# Patient Record
Sex: Male | Born: 1955 | Race: White | Hispanic: No | Marital: Married | Smoking: Former smoker
Health system: Southern US, Community
[De-identification: ages and names within clinical notes are randomized; demographics above are authoritative.]

## PROBLEM LIST (undated history)

## (undated) DIAGNOSIS — R112 Nausea with vomiting, unspecified: Secondary | ICD-10-CM

## (undated) DIAGNOSIS — S5290XA Unspecified fracture of unspecified forearm, initial encounter for closed fracture: Secondary | ICD-10-CM

## (undated) DIAGNOSIS — C801 Malignant (primary) neoplasm, unspecified: Secondary | ICD-10-CM

## (undated) DIAGNOSIS — M1990 Unspecified osteoarthritis, unspecified site: Secondary | ICD-10-CM

## (undated) DIAGNOSIS — R030 Elevated blood-pressure reading, without diagnosis of hypertension: Secondary | ICD-10-CM

## (undated) DIAGNOSIS — I10 Essential (primary) hypertension: Secondary | ICD-10-CM

## (undated) DIAGNOSIS — IMO0001 Reserved for inherently not codable concepts without codable children: Secondary | ICD-10-CM

## (undated) DIAGNOSIS — K5900 Constipation, unspecified: Secondary | ICD-10-CM

## (undated) DIAGNOSIS — Z9889 Other specified postprocedural states: Secondary | ICD-10-CM

## (undated) HISTORY — PX: APPENDECTOMY: SHX54

## (undated) HISTORY — PX: VASECTOMY: SHX75

## (undated) HISTORY — PX: COLON SURGERY: SHX602

## (undated) HISTORY — PX: ANTERIOR CERVICAL DECOMP/DISCECTOMY FUSION: SHX1161

## (undated) HISTORY — PX: FINGER SURGERY: SHX640

## (undated) HISTORY — DX: Essential (primary) hypertension: I10

## (undated) HISTORY — PX: BACK SURGERY: SHX140

## (undated) HISTORY — PX: NASAL RECONSTRUCTION: SHX2069

---

## 2005-01-05 ENCOUNTER — Emergency Department (HOSPITAL_COMMUNITY): Admission: EM | Admit: 2005-01-05 | Discharge: 2005-01-05 | Payer: Self-pay | Admitting: Emergency Medicine

## 2005-04-09 ENCOUNTER — Encounter: Admission: RE | Admit: 2005-04-09 | Discharge: 2005-07-08 | Payer: Self-pay | Admitting: Internal Medicine

## 2005-10-21 ENCOUNTER — Emergency Department (HOSPITAL_COMMUNITY): Admission: EM | Admit: 2005-10-21 | Discharge: 2005-10-22 | Payer: Self-pay | Admitting: Emergency Medicine

## 2005-10-23 ENCOUNTER — Encounter: Admission: RE | Admit: 2005-10-23 | Discharge: 2005-10-23 | Payer: Self-pay | Admitting: Internal Medicine

## 2005-10-29 ENCOUNTER — Encounter: Admission: RE | Admit: 2005-10-29 | Discharge: 2005-10-29 | Payer: Self-pay | Admitting: Internal Medicine

## 2007-09-11 ENCOUNTER — Encounter (INDEPENDENT_AMBULATORY_CARE_PROVIDER_SITE_OTHER): Payer: Self-pay | Admitting: Orthopedic Surgery

## 2007-09-11 ENCOUNTER — Ambulatory Visit (HOSPITAL_BASED_OUTPATIENT_CLINIC_OR_DEPARTMENT_OTHER): Admission: RE | Admit: 2007-09-11 | Discharge: 2007-09-11 | Payer: Self-pay | Admitting: Orthopedic Surgery

## 2009-01-08 ENCOUNTER — Inpatient Hospital Stay (HOSPITAL_COMMUNITY): Admission: EM | Admit: 2009-01-08 | Discharge: 2009-01-09 | Payer: Self-pay | Admitting: Emergency Medicine

## 2009-01-13 ENCOUNTER — Encounter: Admission: RE | Admit: 2009-01-13 | Discharge: 2009-01-13 | Payer: Self-pay | Admitting: General Surgery

## 2009-04-07 ENCOUNTER — Inpatient Hospital Stay (HOSPITAL_COMMUNITY): Admission: RE | Admit: 2009-04-07 | Discharge: 2009-04-10 | Payer: Self-pay | Admitting: General Surgery

## 2009-04-07 ENCOUNTER — Encounter (INDEPENDENT_AMBULATORY_CARE_PROVIDER_SITE_OTHER): Payer: Self-pay | Admitting: General Surgery

## 2009-04-28 ENCOUNTER — Ambulatory Visit: Payer: Self-pay | Admitting: Oncology

## 2009-04-28 LAB — CBC WITH DIFFERENTIAL/PLATELET
Basophils Absolute: 0 10*3/uL (ref 0.0–0.1)
Eosinophils Absolute: 0.1 10*3/uL (ref 0.0–0.5)
HCT: 41.2 % (ref 38.4–49.9)
HGB: 14.6 g/dL (ref 13.0–17.1)
MONO#: 0.4 10*3/uL (ref 0.1–0.9)
NEUT#: 4.1 10*3/uL (ref 1.5–6.5)
NEUT%: 66.5 % (ref 39.0–75.0)
RDW: 12.6 % (ref 11.0–14.6)
lymph#: 1.5 10*3/uL (ref 0.9–3.3)

## 2009-04-28 LAB — COMPREHENSIVE METABOLIC PANEL
AST: 16 U/L (ref 0–37)
Albumin: 4.8 g/dL (ref 3.5–5.2)
BUN: 10 mg/dL (ref 6–23)
CO2: 26 mEq/L (ref 19–32)
Calcium: 9.7 mg/dL (ref 8.4–10.5)
Chloride: 103 mEq/L (ref 96–112)
Glucose, Bld: 114 mg/dL — ABNORMAL HIGH (ref 70–99)
Potassium: 5.1 mEq/L (ref 3.5–5.3)

## 2009-04-28 LAB — LACTATE DEHYDROGENASE: LDH: 175 U/L (ref 94–250)

## 2009-10-27 ENCOUNTER — Ambulatory Visit: Payer: Self-pay | Admitting: Oncology

## 2010-10-20 LAB — CBC
HCT: 42.8 % (ref 39.0–52.0)
Hemoglobin: 14.9 g/dL (ref 13.0–17.0)
MCHC: 34.8 g/dL (ref 30.0–36.0)
MCHC: 35.3 g/dL (ref 30.0–36.0)
Platelets: 215 10*3/uL (ref 150–400)
RBC: 4.11 MIL/uL — ABNORMAL LOW (ref 4.22–5.81)
RBC: 4.54 MIL/uL (ref 4.22–5.81)
RDW: 12.9 % (ref 11.5–15.5)
RDW: 13.1 % (ref 11.5–15.5)
WBC: 4.8 10*3/uL (ref 4.0–10.5)
WBC: 8.6 10*3/uL (ref 4.0–10.5)

## 2010-10-20 LAB — COMPREHENSIVE METABOLIC PANEL
ALT: 28 U/L (ref 0–53)
Albumin: 4 g/dL (ref 3.5–5.2)
Alkaline Phosphatase: 60 U/L (ref 39–117)
Calcium: 9.1 mg/dL (ref 8.4–10.5)
Creatinine, Ser: 1.09 mg/dL (ref 0.4–1.5)
Glucose, Bld: 112 mg/dL — ABNORMAL HIGH (ref 70–99)
Total Bilirubin: 0.8 mg/dL (ref 0.3–1.2)
Total Protein: 7 g/dL (ref 6.0–8.3)

## 2010-10-20 LAB — BASIC METABOLIC PANEL
BUN: 12 mg/dL (ref 6–23)
Chloride: 106 mEq/L (ref 96–112)
Creatinine, Ser: 1.03 mg/dL (ref 0.4–1.5)
GFR calc Af Amer: >60 mL/min (ref >60)
Potassium: 4.3 mEq/L (ref 3.5–5.1)
Sodium: 136 mEq/L (ref 135–145)

## 2010-10-20 LAB — DIFFERENTIAL
Basophils Absolute: 0 10*3/uL (ref 0.0–0.1)
Basophils Relative: 1 % (ref 0–1)
Eosinophils Absolute: 0.1 10*3/uL (ref 0.0–0.7)
Monocytes Absolute: 0.4 10*3/uL (ref 0.1–1.0)

## 2010-10-20 LAB — PROTIME-INR: INR: 1 (ref 0.00–1.49)

## 2010-10-23 LAB — FUNGUS CULTURE W SMEAR: Fungal Smear: NONE SEEN

## 2010-10-23 LAB — CULTURE, ROUTINE-ABSCESS

## 2010-10-23 LAB — ANAEROBIC CULTURE

## 2010-10-23 LAB — DIFFERENTIAL
Basophils Relative: 0 % (ref 0–1)
Eosinophils Relative: 2 % (ref 0–5)
Lymphocytes Relative: 25 % (ref 12–46)
Monocytes Absolute: 0.7 10*3/uL (ref 0.1–1.0)
Neutro Abs: 4.8 10*3/uL (ref 1.7–7.7)

## 2010-10-23 LAB — CBC
HCT: 39.5 % (ref 39.0–52.0)
MCV: 95.7 fL (ref 78.0–100.0)

## 2010-10-23 LAB — CREATININE, SERUM
Creatinine, Ser: 1.06 mg/dL (ref 0.4–1.5)
GFR calc Af Amer: >60 mL/min (ref >60)

## 2010-10-23 LAB — FUNGAL STAIN

## 2010-10-23 LAB — AFB CULTURE WITH SMEAR (NOT AT ARMC): Acid Fast Smear: NONE SEEN

## 2010-10-23 LAB — AFB STAIN: Acid Fast Smear: NONE SEEN

## 2010-11-28 NOTE — Op Note (Signed)
NAMEMARCH, STEYER               ACCOUNT NO.:  [id number]   MEDICAL RECORD NO.:  [id number]          PATIENT TYPE:  INP   LOCATION:  5034                         FACILITY:  [facility]   PHYSICIAN:  Katy Fitch. Sypher, M.D. DATE OF BIRTH:  08/07/1955   DATE OF PROCEDURE:  01/08/2009  DATE OF DISCHARGE:                               OPERATIVE REPORT   PREOPERATIVE DIAGNOSIS:  Probable methicillin-resistant Staphylococcus  aureus abscess, dorsal radial aspect of left wrist and proximal forearm  with multiple other areas of folliculitis.   POSTOPERATIVE DIAGNOSIS:  Probable methicillin-resistant Staphylococcus  aureus abscess, dorsal radial aspect of left wrist and proximal forearm  with multiple other areas of folliculitis.   OPERATION:  Incision and drainage of complex abscess, dorsal radial  aspect left wrist and abscess proximal dorsal left forearm with cultures  for aerobic and anaerobic growth and AFB fungal smears.   OPERATIONS:  Katy Fitch. Sypher, MD   ASSISTANT:  No assistant.   ANESTHESIA:  General by LMA.   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  He progressed to have 2  abscesses on his left forearm and left hand.  He was seen by his primary  care physician, Dr. Nehemiah Settle who is covering for Dr. Valentina Lucks on January 07, 2009, and was noted to have early abscess formation.  He was referred  for an urgent upper extremity orthopedic consult.  Clinical examination  revealed signs of an impending abscess.  He was started on oral  doxycycline 100 mg p.o. q.12 h.  Unfortunately, he did not improve,  developed chills and fever with a measured temperature at 100+  Fahrenheit.   We kept in close phone contact on the afternoon and evening of January 07, 2009, and by midnight on January 07, 2009, his swelling was increasing,  rubor was increasing, and the central  portion of his abscess was  becoming violaceous.   I met him at the [facility] Emergency Room at 12:30 a.m. on January 08, 2009,  admitted him and placed him on IV vancomycin.  He was placed n.p.o. on  IV fluids support and arrangements made for incision and drainage this  morning.   After informed consent, he is brought to the operating room anticipating  incision and drainage of his abscesses.   PROCEDURE:  Gardiner Espana is brought to the operating room and placed in  supine position upon operating table.  Dr. Jean Rosenthal performed an  anesthesia consult in holding area.  Preoperatively, an EKG was obtained  due to his age and history of hypertension.  His CBC was checked and was  notable for a white count of 7.6.  Vital signs revealed a temperature  elevation over 100.  He did not have significant lymph node swelling at  the medial epicondyle, but did have tenderness in his axilla.  He was  beginning to develop ascending lymphangitis overnight.   PROCEDURE:  Julius Matus was brought to room 1 of the [facility] Operating  Room and  placed in supine position upon operating table.  Under Dr.  Rosalene Billings Jackson's direct supervision, general anesthesia by LMA  technique was induced.  The left arm was placed on arm table and a  pneumatic tourniquet applied to proximal left brachium.  The left arm  was prepped with Betadine soap solution and sterilely draped.  After  elevation for 1 minute, the arterial tourniquet on the proximal brachium  was inflated to 250 mmHg.  The procedure commenced with elliptical  excision of the necrotic skin overlying the abscess center.  Subcutaneous tissues were carefully spread with blunt hemostat  recovering copious amounts of frank pus.  Samples were cultured for  aerobic, anaerobic growth, AFB, and fungus smear.   The wound was thoroughly irrigated and continually dissected until all  purulent material was removed.  Copious irrigation of the subcutaneous  tissues was  continued until the effluent was clear.   The wound was then packed with 0.25% Iodoform gauze and left open.  The  proximal forearm wound was likewise attended with a longitudinal  incision through the abscess cavity.  Purulent material was recovered.  Hemostat was used to spread the subcutaneous space followed by packing  with Iodoform.   Both wounds were dressed with sterile 4x4s, sterile Kerlix, and Ace  wrap.   The tourniquet was released with immediate cap refill to the fingers and  thumb.   Ms. Ragas tolerated surgery and anesthesia well.  He was awakened from  general anesthesia, transferred to recovery room with stable vital  signs.  He will be admitted to the hospital for postoperative care and  will continue on the IV vancomycin protocol.   He is provided appropriate analgesics in the form of p.o. and IV  Dilaudid as well as p.o. Percocet.   We will follow cultures and determine whether or not if the patient is  able to continue on home IV therapy versus continued inpatient  management.      Katy Fitch Sypher, M.D.  Electronically Signed     RVS/MEDQ  D:  01/08/2009  T:  01/08/2009  Job:  [id number]   cc:   Deirdre Peer. Polite, M.D.  Thora Lance, M.D.

## 2010-11-28 NOTE — H&P (Signed)
[name], Phillip               ACCOUNT NO.:  [id number]   MEDICAL RECORD NO.:  [id number]          PATIENT TYPE:  INP   LOCATION:  5034                         FACILITY:  [facility]   PHYSICIAN:  Katy Fitch. Sypher, M.D. DATE OF BIRTH:  June 03, 1956   DATE OF ADMISSION:  01/08/2009  DATE OF DISCHARGE:                              HISTORY & PHYSICAL   ADMITTING DIAGNOSES:  Probable MRSA abscesses x2, dorsal radial aspect  of left hand and wrist, and ulnar aspect of left forearm with  deterioration in clinical condition, rule out ascending lymphangitis.   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   On January 01, 2009, he noted a red itchy spot on the dorsal aspect of his  left hand over the index carpometacarpal joint.  He started scratching  this area and immediately noted the development of rubor.  Within 48  hours, he had swelling and increasing pain.  He returned home from a  long-distance trip and was noted to have what appeared to be a rash  developing around the area of the presumed bite and subsequently noted a  second area on his dorsal forearm where he may have scratched this well.  He has had a history of a recent colonoscopy with a diagnosis of  probable malignant colon polyp.  He is being evaluated by Dr. Danise Edge, gastroenterologist, and is scheduled for general surgery  consult.  He has a history of hypertension and is followed for primary  care by Dr. Kirby Funk, of [facility].  He presented for  evaluation at [facility] on January 07, 2009, and was noted to have  probable early abscess formation on the dorsal radial aspect of his left  hand and wrist.  An urgent upper extremity orthopedic consult was  requested.  He was seen at our office where he was noted to have a  temperature of approximately 99.  He had an area of rubor measuring 3 cm  in diameter,  that was marked with a permanent Magic marker.  He had a  slight area of serous drainage at its center.  Over the dorsal aspect of  the forearm, he had another area of rubor measuring 2 cm in diameter  without drainage.  He had no signs of ascending lymphangitis or  lymphadenopathy.  He had not had fever or chills clinically at home.   We advised him that more likely he had a staph infection, likely MRSA  based on his clinical course.  We advised that he begin oral antibiotic  therapy and soaks with close clinical followup.  We foreshadowed that  there was high probability that he may require admission for IV  antibiotics, and possible incision and drainage of his abscesses.   He is allergic to PENICILLIN DERIVATIVES and any SULFA BASED MEDICINES.  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   I contacted him at home at 7 p.m. on January 07, 2009.  He noted that he  was afebrile and was feeling well.  His rubor was improving.  He then  ate.  He began to soak his wrist and hand and at 11:30 in the evening  when checking his wound noted that he had increased rubor and swelling  outside of the Magic marker lines on his hand and increasing pain.  He  contacted me by phone as instructed and I advised him to proceed  directly to the hospital for admission for IV antibiotic therapy.   CURRENT MEDICATIONS:  Aspirin 81 mg daily.   PRIOR SURGERIES:  Lumbar surgery, nasal surgery, and appendectomy.   SOCIAL HISTORY:  He is married.  He is accompanied by his wife at  admission.  He is a one-quarter pack a day smoker.  He denies use of  alcohol beverages.  He is a Agricultural consultant.   FAMILY HISTORY:  Detailed.  Positive for coronary artery disease  affecting his father and no history of other family members with prior  staph infection or MRSA infection.   REVIEW OF SYSTEMS:  A 14-system review of systems was completed and is  positive for probable  diagnosis of colon cancer based on a recent  colonoscopy, December 27, 2008.  He has visual impairment, wears contact  lenses.  He has no history of diabetes, thyroid disease, or gout.   X-rays were obtained in our office.  He had normal bony anatomy.  No  sign of soft tissue gas.  Soft tissue swelling was evident.  There were  no foreign bodies noted.   ASSESSMENT:  1. Clinical deterioration of presumed MRSA infection, left hand and      left dorsal forearm.  2. History of recent diagnosis of probable colon cancer on      colonoscopy.  General Surgery consult pending.  3. History of Stevens-Johnson syndrome after sulfa treatment.  4. History of PENICILLIN ALLERGY.   PLAN:  Phillip [name] is admitted to the hospital, to the orthopedic floor  5000 for IV antibiotic therapy.  He will be maintained on n.p.o. status  on maintenance IV.  We will proceed to the operating room on the morning  of January 08, 2009, for incision and drainage of his abscesses followed by  packing with iodoform gauze and open wound treatment.  We will begin the  vancomycin this evening by protocol.      Katy Fitch Sypher, M.D.  Electronically Signed     RVS/MEDQ  D:  01/08/2009  T:  01/08/2009  Job:  [id number]   cc:   Thora Lance, M.D.  Deirdre Peer. Polite, M.D.

## 2012-02-20 ENCOUNTER — Other Ambulatory Visit: Payer: Self-pay | Admitting: Geriatric Medicine

## 2012-02-20 DIAGNOSIS — R109 Unspecified abdominal pain: Secondary | ICD-10-CM

## 2012-02-22 ENCOUNTER — Ambulatory Visit
Admission: RE | Admit: 2012-02-22 | Discharge: 2012-02-22 | Disposition: A | Discharge status: Home / Self Care | Payer: 59 | Source: Ambulatory Visit | Attending: Geriatric Medicine | Admitting: Geriatric Medicine

## 2012-02-22 DIAGNOSIS — R109 Unspecified abdominal pain: Secondary | ICD-10-CM

## 2012-02-22 MED ORDER — IOHEXOL 300 MG/ML  SOLN
125.0000 mL | Freq: Once | INTRAMUSCULAR | Status: AC | PRN
  Administered 2012-02-22: 125 mL via INTRAVENOUS

## 2012-11-28 ENCOUNTER — Other Ambulatory Visit: Payer: Self-pay | Admitting: Internal Medicine

## 2012-11-28 DIAGNOSIS — M5416 Radiculopathy, lumbar region: Secondary | ICD-10-CM

## 2012-11-28 DIAGNOSIS — M79604 Pain in right leg: Secondary | ICD-10-CM

## 2012-12-01 ENCOUNTER — Ambulatory Visit
Admission: RE | Admit: 2012-12-01 | Discharge: 2012-12-01 | Disposition: A | Discharge status: Home / Self Care | Payer: 59 | Source: Ambulatory Visit | Attending: Internal Medicine | Admitting: Internal Medicine

## 2012-12-01 DIAGNOSIS — M5416 Radiculopathy, lumbar region: Secondary | ICD-10-CM

## 2012-12-01 DIAGNOSIS — M79604 Pain in right leg: Secondary | ICD-10-CM

## 2013-05-29 ENCOUNTER — Other Ambulatory Visit: Payer: Self-pay | Admitting: Orthopedic Surgery

## 2013-05-29 ENCOUNTER — Ambulatory Visit
Admission: RE | Admit: 2013-05-29 | Discharge: 2013-05-29 | Disposition: A | Discharge status: Home / Self Care | Payer: 59 | Source: Ambulatory Visit | Attending: Orthopedic Surgery | Admitting: Orthopedic Surgery

## 2013-05-29 DIAGNOSIS — M542 Cervicalgia: Secondary | ICD-10-CM

## 2013-12-14 ENCOUNTER — Other Ambulatory Visit: Payer: Self-pay | Admitting: Internal Medicine

## 2013-12-14 ENCOUNTER — Encounter (INDEPENDENT_AMBULATORY_CARE_PROVIDER_SITE_OTHER): Payer: Self-pay

## 2013-12-14 ENCOUNTER — Ambulatory Visit
Admission: RE | Admit: 2013-12-14 | Discharge: 2013-12-14 | Disposition: A | Discharge status: Home / Self Care | Payer: 59 | Source: Ambulatory Visit | Attending: Internal Medicine | Admitting: Internal Medicine

## 2013-12-14 DIAGNOSIS — R51 Headache: Principal | ICD-10-CM

## 2013-12-14 DIAGNOSIS — R519 Headache, unspecified: Secondary | ICD-10-CM

## 2014-09-16 ENCOUNTER — Other Ambulatory Visit: Payer: Self-pay | Admitting: Gastroenterology

## 2014-11-30 ENCOUNTER — Other Ambulatory Visit: Payer: Self-pay | Admitting: Gastroenterology

## 2014-11-30 ENCOUNTER — Encounter (HOSPITAL_COMMUNITY): Payer: Self-pay | Admitting: *Deleted

## 2014-12-04 NOTE — Anesthesia Preprocedure Evaluation (Addendum)
Anesthesia Evaluation  Patient identified by MRN, date of birth, ID band Patient awake    Reviewed: Allergy & Precautions, NPO status , Patient's Chart, lab work & pertinent test results, reviewed documented beta blocker date and time   Airway Mallampati: II   Neck ROM: Full    Dental  (+) Teeth Intact, Dental Advisory Given   Pulmonary former smoker,  breath sounds clear to auscultation        Cardiovascular METS: Rhythm:Regular     Neuro/Psych negative neurological ROS  negative psych ROS   GI/Hepatic SP colon resection for CA > 5 years ago   Endo/Other    Renal/GU      Musculoskeletal   Abdominal (+)  Abdomen: soft.    Peds  Hematology   Anesthesia Other Findings   Reproductive/Obstetrics                           Anesthesia Physical Anesthesia Plan  ASA: II  Anesthesia Plan: MAC   Post-op Pain Management:    Induction: Intravenous  Airway Management Planned:   Additional Equipment:   Intra-op Plan:   Post-operative Plan:   Informed Consent: I have reviewed the patients History and Physical, chart, labs and discussed the procedure including the risks, benefits and alternatives for the proposed anesthesia with the patient or authorized representative who has indicated his/her understanding and acceptance.     Plan Discussed with:   Anesthesia Plan Comments:         Anesthesia Quick Evaluation

## 2014-12-06 ENCOUNTER — Encounter (HOSPITAL_COMMUNITY)
Admission: RE | Disposition: A | Discharge status: Home / Self Care | Payer: Self-pay | Source: Ambulatory Visit | Attending: Gastroenterology

## 2014-12-06 ENCOUNTER — Ambulatory Visit (HOSPITAL_COMMUNITY): Payer: BLUE CROSS/BLUE SHIELD | Admitting: Anesthesiology

## 2014-12-06 ENCOUNTER — Encounter (HOSPITAL_COMMUNITY): Payer: Self-pay | Admitting: Anesthesiology

## 2014-12-06 ENCOUNTER — Ambulatory Visit (HOSPITAL_COMMUNITY)
Admission: RE | Admit: 2014-12-06 | Discharge: 2014-12-06 | Disposition: A | Discharge status: Home / Self Care | Payer: BLUE CROSS/BLUE SHIELD | Source: Ambulatory Visit | Attending: Gastroenterology | Admitting: Gastroenterology

## 2014-12-06 DIAGNOSIS — Z85048 Personal history of other malignant neoplasm of rectum, rectosigmoid junction, and anus: Secondary | ICD-10-CM | POA: Insufficient documentation

## 2014-12-06 DIAGNOSIS — Z85038 Personal history of other malignant neoplasm of large intestine: Secondary | ICD-10-CM | POA: Diagnosis not present

## 2014-12-06 DIAGNOSIS — Z87891 Personal history of nicotine dependence: Secondary | ICD-10-CM | POA: Insufficient documentation

## 2014-12-06 DIAGNOSIS — D125 Benign neoplasm of sigmoid colon: Secondary | ICD-10-CM | POA: Diagnosis not present

## 2014-12-06 DIAGNOSIS — Z9049 Acquired absence of other specified parts of digestive tract: Secondary | ICD-10-CM | POA: Insufficient documentation

## 2014-12-06 DIAGNOSIS — Z08 Encounter for follow-up examination after completed treatment for malignant neoplasm: Secondary | ICD-10-CM | POA: Diagnosis present

## 2014-12-06 HISTORY — PX: COLONOSCOPY WITH PROPOFOL: SHX5780

## 2014-12-06 HISTORY — DX: Unspecified osteoarthritis, unspecified site: M19.90

## 2014-12-06 HISTORY — DX: Other specified postprocedural states: R11.2

## 2014-12-06 HISTORY — DX: Reserved for inherently not codable concepts without codable children: IMO0001

## 2014-12-06 HISTORY — DX: Elevated blood-pressure reading, without diagnosis of hypertension: R03.0

## 2014-12-06 HISTORY — DX: Nausea with vomiting, unspecified: Z98.890

## 2014-12-06 HISTORY — DX: Constipation, unspecified: K59.00

## 2014-12-06 HISTORY — DX: Malignant (primary) neoplasm, unspecified: C80.1

## 2014-12-06 HISTORY — DX: Unspecified fracture of unspecified forearm, initial encounter for closed fracture: S52.90XA

## 2014-12-06 SURGERY — COLONOSCOPY WITH PROPOFOL
Anesthesia: Monitor Anesthesia Care

## 2014-12-06 MED ORDER — PROPOFOL INFUSION 10 MG/ML OPTIME
INTRAVENOUS | Status: DC | PRN
  Administered 2014-12-06: 120 ug/kg/min via INTRAVENOUS

## 2014-12-06 MED ORDER — PROPOFOL 10 MG/ML IV BOLUS
INTRAVENOUS | Status: AC
  Filled 2014-12-06: qty 20

## 2014-12-06 MED ORDER — LACTATED RINGERS IV SOLN
INTRAVENOUS | Status: DC
  Administered 2014-12-06: 1000 mL via INTRAVENOUS

## 2014-12-06 MED ORDER — PROMETHAZINE HCL 25 MG/ML IJ SOLN: 6.2500 mg | INTRAMUSCULAR | Status: DC | PRN

## 2014-12-06 MED ORDER — SODIUM CHLORIDE 0.9 % IV SOLN: INTRAVENOUS | Status: DC

## 2014-12-06 MED ORDER — MEPERIDINE HCL 100 MG/ML IJ SOLN: 6.2500 mg | INTRAMUSCULAR | Status: DC | PRN

## 2014-12-06 SURGICAL SUPPLY — 22 items

## 2014-12-06 NOTE — Discharge Instructions (Signed)

## 2014-12-06 NOTE — Anesthesia Postprocedure Evaluation (Signed)
  Anesthesia Post-op Note  Patient: Phillip [name]  Procedure(s) Performed: Procedure(s): COLONOSCOPY WITH PROPOFOL (N/A)  Patient Location: PACU  Anesthesia Type:MAC  Level of Consciousness: awake and alert   Airway and Oxygen Therapy: Patient Spontanous Breathing  Post-op Pain: none  Post-op Assessment: Post-op Vital signs reviewed, Patient's Cardiovascular Status Stable and Patent Airway  Post-op Vital Signs: Reviewed and stable  Last Vitals:  Filed Vitals:   12/06/14 0903  BP: 154/139  Pulse:   Temp:   Resp:     Complications: No apparent anesthesia complications

## 2014-12-06 NOTE — Transfer of Care (Signed)
Immediate Anesthesia Transfer of Care Note  Patient: Phillip [name]  Procedure(s) Performed: Procedure(s): COLONOSCOPY WITH PROPOFOL (N/A)  Patient Location: PACU  Anesthesia Type:MAC  Level of Consciousness:  sedated, patient cooperative and responds to stimulation  Airway & Oxygen Therapy:Patient Spontanous Breathing and Patient connected to face mask oxgen  Post-op Assessment:  Report given to PACU RN and Post -op Vital signs reviewed and stable  Post vital signs:  Reviewed and stable  Last Vitals:  Filed Vitals:   12/06/14 0903  BP: 154/139  Pulse:   Temp:   Resp:     Complications: No apparent anesthesia complications

## 2014-12-06 NOTE — Op Note (Signed)
Procedure: Surveillance colonoscopy. Stage I rectal cancer surgery performed in 2010  Endoscopist: Earle Gell  Premedication: Propofol administered by anesthesia  Procedure: The patient was placed in the left lateral decubitus position. Anal inspection and digital rectal exam were normal. The Pentax pediatric colonoscope was introduced into the rectum and advanced to the cecum. A normal-appearing appendiceal orifice and ileocecal valve were identified. Colonic preparation for the exam today was good. Withdrawal time was 15 minutes  Rectum. Normal post rectal cancer surgery. Surgical anastomosis appeared normal. Retroflexed view of the distal rectum was normal.  Sigmoid colon. From the distal sigmoid colon, a 7 mm pedunculated polyp was removed with the hot snare and a 4 mm sessile polyp was removed with the cold snare  Descending colon. Normal  Splenic flexure. Normal  Transverse colon. Normal  Hepatic flexure. Normal  Ascending colon. Normal  Cecum and ileocecal valve. Normal  Assessment: Two small polyps were removed from the distal sigmoid colon. Otherwise normal surveillance colonoscopy post stage I rectal cancer surgery.  Recommendation: Schedule surveillance colonoscopy in 5 years

## 2014-12-06 NOTE — H&P (Signed)
  Procedure: Surveillance colonoscopy. Stage I rectal cancer surgery performed in 2010  History: The patient is a 59 year old male born 02/11/1947. He is scheduled to undergo a surveillance colonoscopy today.  Past medical history: Rectal cancer surgery performed in 2010. Metabolic syndrome. Nose fracture surgery. Back surgery. Appendectomy. Left wrist and arm surgery.  Medication allergies: Sulfa drugs. Mycin drugs. Penicillin caused anaphylaxis.  Exam: The patient is alert and lying comfortably on the endoscopy stretcher. Abdomen is soft and nontender to palpation. Lungs are clear to auscultation. Cardiac exam reveals a regular rhythm.  Plan: Proceed with surveillance colonoscopy

## 2014-12-07 ENCOUNTER — Encounter (HOSPITAL_COMMUNITY): Payer: Self-pay | Admitting: Gastroenterology

## 2018-01-31 ENCOUNTER — Other Ambulatory Visit: Payer: Self-pay | Admitting: Internal Medicine

## 2018-01-31 ENCOUNTER — Ambulatory Visit
Admission: RE | Admit: 2018-01-31 | Discharge: 2018-01-31 | Disposition: A | Discharge status: Home / Self Care | Payer: BLUE CROSS/BLUE SHIELD | Source: Ambulatory Visit | Attending: Internal Medicine | Admitting: Internal Medicine

## 2018-01-31 DIAGNOSIS — M5410 Radiculopathy, site unspecified: Secondary | ICD-10-CM

## 2018-05-05 DIAGNOSIS — R7301 Impaired fasting glucose: Secondary | ICD-10-CM | POA: Diagnosis not present

## 2018-05-05 DIAGNOSIS — Z8249 Family history of ischemic heart disease and other diseases of the circulatory system: Secondary | ICD-10-CM | POA: Diagnosis not present

## 2018-05-05 DIAGNOSIS — E782 Mixed hyperlipidemia: Secondary | ICD-10-CM | POA: Diagnosis not present

## 2018-06-18 DIAGNOSIS — K5901 Slow transit constipation: Secondary | ICD-10-CM | POA: Diagnosis not present

## 2018-06-18 DIAGNOSIS — Z23 Encounter for immunization: Secondary | ICD-10-CM | POA: Diagnosis not present

## 2019-02-03 DIAGNOSIS — M7702 Medial epicondylitis, left elbow: Secondary | ICD-10-CM | POA: Diagnosis not present

## 2019-02-03 DIAGNOSIS — M65312 Trigger thumb, left thumb: Secondary | ICD-10-CM | POA: Diagnosis not present

## 2019-03-27 DIAGNOSIS — Z23 Encounter for immunization: Secondary | ICD-10-CM | POA: Diagnosis not present

## 2019-03-27 DIAGNOSIS — E782 Mixed hyperlipidemia: Secondary | ICD-10-CM | POA: Diagnosis not present

## 2019-03-27 DIAGNOSIS — Z85038 Personal history of other malignant neoplasm of large intestine: Secondary | ICD-10-CM | POA: Diagnosis not present

## 2019-03-27 DIAGNOSIS — G609 Hereditary and idiopathic neuropathy, unspecified: Secondary | ICD-10-CM | POA: Diagnosis not present

## 2019-03-27 DIAGNOSIS — R7301 Impaired fasting glucose: Secondary | ICD-10-CM | POA: Diagnosis not present

## 2019-03-27 DIAGNOSIS — M79672 Pain in left foot: Secondary | ICD-10-CM | POA: Diagnosis not present

## 2019-03-27 DIAGNOSIS — Z0000 Encounter for general adult medical examination without abnormal findings: Secondary | ICD-10-CM | POA: Diagnosis not present

## 2019-03-27 DIAGNOSIS — Z1159 Encounter for screening for other viral diseases: Secondary | ICD-10-CM | POA: Diagnosis not present

## 2019-04-03 ENCOUNTER — Other Ambulatory Visit: Payer: Self-pay

## 2019-04-03 ENCOUNTER — Encounter: Payer: Self-pay | Admitting: Neurology

## 2019-04-03 DIAGNOSIS — R200 Anesthesia of skin: Secondary | ICD-10-CM

## 2019-04-22 ENCOUNTER — Other Ambulatory Visit: Payer: Self-pay

## 2019-04-22 DIAGNOSIS — Z20822 Contact with and (suspected) exposure to covid-19: Secondary | ICD-10-CM

## 2019-04-23 LAB — NOVEL CORONAVIRUS, NAA: SARS-CoV-2, NAA: NOT DETECTED

## 2019-05-12 ENCOUNTER — Other Ambulatory Visit: Payer: Self-pay

## 2019-05-12 ENCOUNTER — Ambulatory Visit: Payer: BC Managed Care – PPO | Admitting: Neurology

## 2019-05-12 DIAGNOSIS — R200 Anesthesia of skin: Secondary | ICD-10-CM

## 2019-05-12 NOTE — Procedures (Signed)
[facility]  [address] [place]  [place], [place] [address] Tel: [phone] Fax:  [phone] Test Date:  05/12/2019  Patient: Phillip [name]  DOB: Nov 13, 1955 Physician: Narda Amber, DO  Sex: Male Height: 6\' 1"  Ref Phys: Lavone Orn, MD  ID#: [id number] Temp: 35.0C Technician:    Patient Complaints: This is a 63 year old man referred for evaluation of chronic feet pain.  NCV & EMG Findings: Electrodiagnostic testing was prematurely aborted at patient's request due to pain.  Nerve conduction study of the right superficial peroneal and peroneal motor response was within normal limits.  Impression: This is an incomplete study.  Electrodiagnostic testing was terminated at patient's request due to pain.   ___________________________ Narda Amber, DO    Nerve Conduction Studies Anti Sensory Summary Table   Site NR Peak (ms) Norm Peak (ms) P-T Amp (V) Norm P-T Amp  Right Sup Peroneal Anti Sensory (Ant Lat Mall)  35C  12 cm    3.7 <4.6 3.7 >3   Motor Summary Table   Site NR Onset (ms) Norm Onset (ms) O-P Amp (mV) Norm O-P Amp Site1 Site2 Delta-0 (ms) Dist (cm) Vel (m/s) Norm Vel (m/s)  Right Peroneal Motor (Ext Dig Brev)  35C  Ankle    2.6 <6.0 6.0 >2.5 B Fib Ankle 9.0 0.0  >40  B Fib    11.6  5.0             Waveforms:

## 2019-08-14 DIAGNOSIS — R10819 Abdominal tenderness, unspecified site: Secondary | ICD-10-CM | POA: Diagnosis not present

## 2019-08-14 DIAGNOSIS — S6992XA Unspecified injury of left wrist, hand and finger(s), initial encounter: Secondary | ICD-10-CM | POA: Diagnosis not present

## 2019-12-08 DIAGNOSIS — H52223 Regular astigmatism, bilateral: Secondary | ICD-10-CM | POA: Diagnosis not present

## 2020-05-16 DIAGNOSIS — R059 Cough, unspecified: Secondary | ICD-10-CM | POA: Diagnosis not present

## 2020-05-16 DIAGNOSIS — R52 Pain, unspecified: Secondary | ICD-10-CM | POA: Diagnosis not present

## 2020-05-16 DIAGNOSIS — R509 Fever, unspecified: Secondary | ICD-10-CM | POA: Diagnosis not present

## 2020-05-16 DIAGNOSIS — Z20822 Contact with and (suspected) exposure to covid-19: Secondary | ICD-10-CM | POA: Diagnosis not present

## 2020-05-17 DIAGNOSIS — Z20822 Contact with and (suspected) exposure to covid-19: Secondary | ICD-10-CM | POA: Diagnosis not present

## 2020-05-17 DIAGNOSIS — U071 COVID-19: Secondary | ICD-10-CM | POA: Diagnosis not present

## 2020-05-17 DIAGNOSIS — R52 Pain, unspecified: Secondary | ICD-10-CM | POA: Diagnosis not present

## 2020-05-17 DIAGNOSIS — R509 Fever, unspecified: Secondary | ICD-10-CM | POA: Diagnosis not present

## 2020-05-18 ENCOUNTER — Telehealth: Payer: Self-pay | Admitting: Physician Assistant

## 2020-05-18 NOTE — Telephone Encounter (Signed)
Called to discuss with patient about Covid symptoms and the use of sotrovimab, bamlanivimab/etesevimab or casirivimab/imdevimab, a monoclonal antibody infusion for those with mild to moderate Covid symptoms and at a high risk of hospitalization.  Pt is qualified for this infusion at the [facility] infusion center due to; Specific high risk criteria : Immunosuppressive Disease or Treatment (colon cancer hx)   Message left to call back our hotline [phone] and sent a text.   Angelena Form PA-C  [facility]

## 2020-05-19 DIAGNOSIS — Z1159 Encounter for screening for other viral diseases: Secondary | ICD-10-CM | POA: Diagnosis not present

## 2020-07-07 DIAGNOSIS — M5030 Other cervical disc degeneration, unspecified cervical region: Secondary | ICD-10-CM | POA: Diagnosis not present

## 2020-07-07 DIAGNOSIS — Z0000 Encounter for general adult medical examination without abnormal findings: Secondary | ICD-10-CM | POA: Diagnosis not present

## 2020-07-07 DIAGNOSIS — E782 Mixed hyperlipidemia: Secondary | ICD-10-CM | POA: Diagnosis not present

## 2020-07-07 DIAGNOSIS — Z23 Encounter for immunization: Secondary | ICD-10-CM | POA: Diagnosis not present

## 2020-07-07 DIAGNOSIS — I10 Essential (primary) hypertension: Secondary | ICD-10-CM | POA: Diagnosis not present

## 2020-07-07 DIAGNOSIS — R7301 Impaired fasting glucose: Secondary | ICD-10-CM | POA: Diagnosis not present

## 2020-08-02 DIAGNOSIS — S83209A Unspecified tear of unspecified meniscus, current injury, unspecified knee, initial encounter: Secondary | ICD-10-CM

## 2020-08-02 HISTORY — DX: Unspecified tear of unspecified meniscus, current injury, unspecified knee, initial encounter: S83.209A

## 2020-08-04 DIAGNOSIS — I10 Essential (primary) hypertension: Secondary | ICD-10-CM | POA: Diagnosis not present

## 2020-08-04 DIAGNOSIS — H02402 Unspecified ptosis of left eyelid: Secondary | ICD-10-CM | POA: Diagnosis not present

## 2020-08-15 DIAGNOSIS — M25562 Pain in left knee: Secondary | ICD-10-CM | POA: Diagnosis not present

## 2020-08-15 DIAGNOSIS — M25561 Pain in right knee: Secondary | ICD-10-CM | POA: Diagnosis not present

## 2020-08-17 DIAGNOSIS — M25561 Pain in right knee: Secondary | ICD-10-CM | POA: Diagnosis not present

## 2020-09-08 ENCOUNTER — Encounter: Payer: Self-pay | Admitting: Neurology

## 2020-09-08 DIAGNOSIS — I10 Essential (primary) hypertension: Secondary | ICD-10-CM | POA: Diagnosis not present

## 2020-09-08 DIAGNOSIS — H02402 Unspecified ptosis of left eyelid: Secondary | ICD-10-CM | POA: Diagnosis not present

## 2020-09-23 HISTORY — PX: KNEE CARTILAGE SURGERY: SHX688

## 2020-10-06 NOTE — Progress Notes (Deleted)
[facility] Clinic Note - Initial Visit   Date: 10/06/20  KRISTAPHER DUBUQUE MRN: [id number] DOB: Nov 16, 1955   Dear Dr Laurann Montana, Jenny Reichmann, MD:  Thank you for your kind referral of Phillip [name] for consultation of ***. Although his history is well known to you, please allow Phillip [name] to reiterate it for the purpose of our medical record. The patient was accompanied to the clinic by *** who also provides collateral information.     History of Present Illness: Phillip [name] is a 65 y.o. ***-handed male with *** presenting for evaluation of left ptosis.    AChR antibody was negative.   Out-side paper records, electronic medical record, and images have been reviewed where available and summarized as: *** No results found for: HGBA1C No results found for: VITAMINB12 No results found for: TSH No results found for: ESRSEDRATE, POCTSEDRATE  Past Medical History:  Diagnosis Date  . Arthritis    fingers left little finger,4th joint after.  . Blood pressure elevated    controls with diet and aspirin  . Cancer [facility])    colon cancer dx.-surgery with 2 inches of rectum removed.only 5 yrs ago  . Constipation    uses fiber to help regulate  . Fracture, radius    left arm, age 34   . PONV (postoperative nausea and vomiting)     Past Surgical History:  Procedure Laterality Date  . ANTERIOR CERVICAL DECOMP/DISCECTOMY FUSION Right   . APPENDECTOMY    . BACK SURGERY     rupture disc.  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 COLONOSCOPY WITH PROPOFOL N/A 12/06/2014   Procedure: COLONOSCOPY WITH PROPOFOL;  Surgeon: [name], MD;  Location: [facility];  Service: Endoscopy;  Laterality: N/A;  . FINGER SURGERY Left    left index finger"wart removal"  . NASAL RECONSTRUCTION     trauma repair  . VASECTOMY       Medications:  Outpatient Encounter Medications as of 10/07/2020  Medication Sig  . Ascorbic Acid (VITAMIN C PO)  Take 1 tablet by mouth every morning.  . beta carotene w/minerals (OCUVITE) tablet Take 1 tablet by mouth every morning.  . Cholecalciferol (VITAMIN D PO) Take 1 tablet by mouth every morning.  . fluticasone (FLONASE) 50 MCG/ACT nasal spray Place 1 spray into both nostrils daily as needed for allergies or rhinitis.  [name] ibuprofen (ADVIL,MOTRIN) 200 MG tablet Take 400 mg by mouth every 6 (six) hours as needed for headache or moderate pain.  . Multiple Vitamin (MULTIVITAMIN WITH MINERALS) TABS tablet Take 1 tablet by mouth every morning.   No facility-administered encounter medications on file as of 10/07/2020.    Allergies:  Allergies  Allergen Reactions  . Penicillins Anaphylaxis and Hives    + any in the cillin family.   . 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     Family History: No family history on file.  Social History: Social History   Tobacco Use  . Smoking status: Former Smoker    Types: Cigarettes, Cigars, Pipe    Quit date: 11/30/1966    Years since quitting: 53.8  . Smokeless tobacco: Former Systems developer    Types: Chew  . Tobacco comment: Quit after teen yrs- no tobbacco use x15 yrs, Chewing tobacco x4 yrs ago  Substance Use Topics  . Alcohol use: No  . Drug use: No   Social History   Social History  Narrative  . Not on file    Vital Signs:  There were no vitals taken for this visit.   General Medical Exam:  *** General:  Well appearing, comfortable.   Eyes/ENT: see cranial nerve examination.   Neck:   No carotid bruits. Respiratory:  Clear to auscultation, good air entry bilaterally.   Cardiac:  Regular rate and rhythm, no murmur.   Extremities:  No deformities, edema, or skin discoloration.  Skin:  No rashes or lesions.  Neurological Exam: MENTAL STATUS including orientation to time, place, person, recent and remote memory, attention span and concentration, language, and fund of knowledge is ***normal.   Speech is not dysarthric.  CRANIAL NERVES: II:  No visual field defects.  Unremarkable fundi.   III-IV-VI: Pupils equal round and reactive to light.  Normal conjugate, extra-ocular eye movements in all directions of gaze.  No nystagmus.  No ptosis***.   V:  Normal facial sensation.    VII:  Normal facial symmetry and movements.   VIII:  Normal hearing and vestibular function.   IX-X:  Normal palatal movement.   XI:  Normal shoulder shrug and head rotation.   XII:  Normal tongue strength and range of motion, no deviation or fasciculation.  MOTOR:  No atrophy, fasciculations or abnormal movements.  No pronator drift.   Upper Extremity:  Right  Left  Deltoid  5/5   5/5   Biceps  5/5   5/5   Triceps  5/5   5/5   Infraspinatus 5/5  5/5  Medial pectoralis 5/5  5/5  Wrist extensors  5/5   5/5   Wrist flexors  5/5   5/5   Finger extensors  5/5   5/5   Finger flexors  5/5   5/5   Dorsal interossei  5/5   5/5   Abductor pollicis  5/5   5/5   Tone (Ashworth scale)  0  0   Lower Extremity:  Right  Left  Hip flexors  5/5   5/5   Hip extensors  5/5   5/5   Adductor 5/5  5/5  Abductor 5/5  5/5  Knee flexors  5/5   5/5   Knee extensors  5/5   5/5   Dorsiflexors  5/5   5/5   Plantarflexors  5/5   5/5   Toe extensors  5/5   5/5   Toe flexors  5/5   5/5   Tone (Ashworth scale)  0  0   MSRs:  Right        Left                  brachioradialis 2+  2+  biceps 2+  2+  triceps 2+  2+  patellar 2+  2+  ankle jerk 2+  2+  Hoffman no  no  plantar response down  down   SENSORY:  Normal and symmetric perception of light touch, pinprick, vibration, and proprioception.  Romberg's sign absent.   COORDINATION/GAIT: Normal finger-to- nose-finger and heel-to-shin.  Intact rapid alternating movements bilaterally.  Able to rise from a chair without using arms.  Gait narrow based and stable. Tandem and stressed gait intact.    IMPRESSION: ***  PLAN/RECOMMENDATIONS:  *** Return to clinic in ***  months.  Total time spent: ***   Thank you for allowing me to participate in patient's care.  If I can answer any additional questions, I would be pleased to do so.    Sincerely,  Sirenia Whitis K. Posey Pronto, DO

## 2020-10-07 ENCOUNTER — Ambulatory Visit: Payer: Self-pay | Admitting: Neurology

## 2020-10-07 ENCOUNTER — Encounter: Payer: Self-pay | Admitting: Neurology

## 2020-10-07 DIAGNOSIS — Z029 Encounter for administrative examinations, unspecified: Secondary | ICD-10-CM

## 2020-10-14 ENCOUNTER — Ambulatory Visit (INDEPENDENT_AMBULATORY_CARE_PROVIDER_SITE_OTHER): Payer: BC Managed Care – PPO | Admitting: Neurology

## 2020-10-14 ENCOUNTER — Encounter: Payer: Self-pay | Admitting: Neurology

## 2020-10-14 ENCOUNTER — Other Ambulatory Visit: Payer: Self-pay

## 2020-10-14 VITALS — BP 178/96 | HR 71 | Ht 73.0 in | Wt 254.0 lb

## 2020-10-14 DIAGNOSIS — H532 Diplopia: Secondary | ICD-10-CM

## 2020-10-14 DIAGNOSIS — H02402 Unspecified ptosis of left eyelid: Secondary | ICD-10-CM | POA: Diagnosis not present

## 2020-10-14 NOTE — Patient Instructions (Signed)
Refer to [facility] for single fiber EMG

## 2020-10-14 NOTE — Progress Notes (Signed)
[name] Clinic Note - Initial Visit   Date: 10/14/20  ARBIE BLANKLEY MRN: [id number] DOB: Feb 13, 1956   Dear Dr. Laurann Montana:  Thank you for your kind referral of Phillip [name] for consultation of left ptosis. Although his history is well known to you, please allow [name] to reiterate it for the purpose of our medical record. The patient was accompanied to the clinic by self.   History of Present Illness: MITHCELL [name] is a 65 y.o. ambidextrous-handed male with colon cancer (2010) and hypertension presenting for evaluation of left ptosis.  Starting around ~2017, he began having droopiness of the left eyelid and double vision, described as images on top of each other.  Symptoms occur daily and generally improve with blinking or closing one eye. It is not constant.  It is  worse when he is tired. He denies difficulty swallowing/talking, or limb weakness.  He is light sensitive and feels that his eye closes more in bright lights. AChR antibody tested performed by his PCP in January 2022 was negative. He drives truck locally.  Vision changes has not interfered with his driving.    Past Medical History:  Diagnosis Date  . Arthritis    fingers left little finger,4th joint after.  . Blood pressure elevated    controls with diet and aspirin  . Cancer [facility])    colon cancer dx.-surgery with 2 inches of rectum removed.only 5 yrs ago  . Constipation    uses fiber to help regulate  . Fracture, radius    left arm, age 24   . Hypertension   . PONV (postoperative nausea and vomiting)   . Torn meniscus 08/02/2020   [place] on sleet/ice    Past Surgical History:  Procedure Laterality Date  . ANTERIOR CERVICAL DECOMP/DISCECTOMY FUSION Right   . APPENDECTOMY    . BACK SURGERY     rupture disc.  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 COLONOSCOPY WITH PROPOFOL N/A 12/06/2014   Procedure: COLONOSCOPY WITH PROPOFOL;  Surgeon: [name], MD;  Location: [facility];  Service: Endoscopy;  Laterality: N/A;  . FINGER SURGERY Left    left index finger"wart removal"  . KNEE CARTILAGE SURGERY Right 09/23/2020   Repair torn Meniscus   . NASAL RECONSTRUCTION     trauma repair  . VASECTOMY       Medications:  Outpatient Encounter Medications as of 10/14/2020  Medication Sig  . amLODipine (NORVASC) 5 MG tablet Take 5 mg by mouth daily.  . Ascorbic Acid (VITAMIN C PO) Take 1 tablet by mouth every morning.  . beta carotene w/minerals (OCUVITE) tablet Take 1 tablet by mouth every morning.  . celecoxib (CELEBREX) 200 MG capsule TAKE 1 CAPSULE WITH FOOD ONCE A DAY IF NEEDED  . Cholecalciferol (VITAMIN D PO) Take 1 tablet by mouth every morning.  . Multiple Vitamin (MULTIVITAMIN WITH MINERALS) TABS tablet Take 1 tablet by mouth every morning.  [name] omeprazole (PRILOSEC) 20 MG capsule Take 20 mg by mouth daily.  . fluticasone (FLONASE) 50 MCG/ACT nasal spray Place 1 spray into both nostrils daily as needed for allergies or rhinitis. (Patient not taking: Reported on 10/14/2020)  . ibuprofen (ADVIL,MOTRIN) 200 MG tablet Take 400 mg by mouth every 6 (six) hours as needed for headache or moderate pain. (Patient not taking: Reported on 10/14/2020)   No facility-administered encounter medications on file as of 10/14/2020.    Allergies:  Allergies  Allergen Reactions  .  Penicillins Anaphylaxis and Hives    + any in the cillin family.   . 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     Family History: Family History  Problem Relation Age of Onset  . Hypotension Mother     Social History: Social History   Tobacco Use  . Smoking status: Former Smoker    Types: Cigarettes, Cigars, Pipe    Quit date: 11/30/1966    Years since quitting: 53.9  . Smokeless tobacco: Former Systems developer    Types: Chew  . Tobacco comment: Quit after teen yrs- no tobbacco use x15 yrs, Chewing tobacco x4 yrs ago   Substance Use Topics  . Alcohol use: No  . Drug use: No   Social History   Social History Narrative   Both left and right handed    Lives in a one story home with a basement   Drinks caffeine     Vital Signs:  BP (!) 178/96   Pulse 71   Ht 6\' 1"  (1.854 m)   Wt 254 lb (115.2 kg)   SpO2 97%   BMI 33.51 kg/m   Neurological Exam: MENTAL STATUS including orientation to time, place, person, recent and remote memory, attention span and concentration, language, and fund of knowledge is normal.  Speech is not dysarthric.  CRANIAL NERVES: II:  No visual field defects.  Unremarkable fundi.   III-IV-VI: Pupils equal round and reactive to light.  Normal conjugate, extra-ocular eye movements in all directions of gaze.  No nystagmus.  Moderate left ptosis, worse with sustained upgaze.  No ptosis on the right. .   V:  Normal facial sensation.    VII:  Normal facial symmetry and movements.   VIII:  Normal hearing and vestibular function.   IX-X:  Normal palatal movement.   XI:  Normal shoulder shrug and head rotation.   XII:  Normal tongue strength and range of motion, no deviation or fasciculation.  MOTOR:  No atrophy, fasciculations or abnormal movements.  No pronator drift.   Upper Extremity:  Right  Left  Deltoid  5/5   5/5   Biceps  5/5   5/5   Triceps  5/5   5/5   Infraspinatus 5/5  5/5  Medial pectoralis 5/5  5/5  Wrist extensors  5/5   5/5   Wrist flexors  5/5   5/5   Finger extensors  5/5   5/5   Finger flexors  5/5   5/5   Dorsal interossei  5/5   5/5   Abductor pollicis  5/5   5/5   Tone (Ashworth scale)  0  0   Lower Extremity:  Right  Left  Hip flexors  5/5   5/5   Hip extensors  5/5   5/5   Adductor 5/5  5/5  Abductor 5/5  5/5  Knee flexors  5/5   5/5   Knee extensors  5/5   5/5   Dorsiflexors  5/5   5/5   Plantarflexors  5/5   5/5   Toe extensors  5/5   5/5   Toe flexors  5/5   5/5   Tone (Ashworth scale)  0  0   MSRs:  Right        Left                   brachioradialis 2+  2+  biceps 2+  2+  triceps 2+  2+  patellar 2+  2+  ankle jerk 2+  2+  Hoffman no  no  plantar response down  down   SENSORY:  Vibration is reduced at the toes bilaterally, pin prick is mildly reduced in the feet. Sensation elsewhere intact.  Romberg's sign absent.   COORDINATION/GAIT: Normal finger-to- nose-finger  Intact rapid alternating movements bilaterally. Gait narrow based and stable. Tandem and stressed gait intact.    IMPRESSION: Left ptosis with diplopia.  AChR antibody negative.  Myasthenia gravis remains high on the differential.  With his isolated ocular symptoms, I will refer him to get single fiber EMG.  I also discussed trial of mestinon and opted to consider this after his testing.    Thank you for allowing me to participate in patient's care.  If I can answer any additional questions, I would be pleased to do so.    Sincerely,    Remy Dia K. Posey Pronto, DO

## 2020-10-17 ENCOUNTER — Other Ambulatory Visit: Payer: Self-pay

## 2021-01-05 DIAGNOSIS — R0789 Other chest pain: Secondary | ICD-10-CM | POA: Diagnosis not present

## 2021-01-05 DIAGNOSIS — I10 Essential (primary) hypertension: Secondary | ICD-10-CM | POA: Diagnosis not present

## 2021-01-30 DIAGNOSIS — R7301 Impaired fasting glucose: Secondary | ICD-10-CM | POA: Diagnosis not present

## 2021-01-30 DIAGNOSIS — Z85038 Personal history of other malignant neoplasm of large intestine: Secondary | ICD-10-CM | POA: Diagnosis not present

## 2021-01-30 DIAGNOSIS — I10 Essential (primary) hypertension: Secondary | ICD-10-CM | POA: Diagnosis not present

## 2021-02-02 ENCOUNTER — Other Ambulatory Visit: Payer: Self-pay

## 2021-02-02 ENCOUNTER — Emergency Department (HOSPITAL_COMMUNITY)
Admission: EM | Admit: 2021-02-02 | Discharge: 2021-02-03 | Disposition: A | Discharge status: Home / Self Care | Payer: BC Managed Care – PPO | Attending: Emergency Medicine | Admitting: Emergency Medicine

## 2021-02-02 ENCOUNTER — Emergency Department (HOSPITAL_COMMUNITY): Payer: BC Managed Care – PPO

## 2021-02-02 DIAGNOSIS — R072 Precordial pain: Secondary | ICD-10-CM | POA: Diagnosis not present

## 2021-02-02 DIAGNOSIS — Z87891 Personal history of nicotine dependence: Secondary | ICD-10-CM | POA: Insufficient documentation

## 2021-02-02 DIAGNOSIS — R61 Generalized hyperhidrosis: Secondary | ICD-10-CM | POA: Insufficient documentation

## 2021-02-02 DIAGNOSIS — R0902 Hypoxemia: Secondary | ICD-10-CM | POA: Diagnosis not present

## 2021-02-02 DIAGNOSIS — I10 Essential (primary) hypertension: Secondary | ICD-10-CM | POA: Insufficient documentation

## 2021-02-02 DIAGNOSIS — Z79899 Other long term (current) drug therapy: Secondary | ICD-10-CM | POA: Diagnosis not present

## 2021-02-02 DIAGNOSIS — R079 Chest pain, unspecified: Secondary | ICD-10-CM | POA: Diagnosis not present

## 2021-02-02 DIAGNOSIS — R1013 Epigastric pain: Secondary | ICD-10-CM | POA: Insufficient documentation

## 2021-02-02 DIAGNOSIS — Z85038 Personal history of other malignant neoplasm of large intestine: Secondary | ICD-10-CM | POA: Diagnosis not present

## 2021-02-02 DIAGNOSIS — R0602 Shortness of breath: Secondary | ICD-10-CM | POA: Insufficient documentation

## 2021-02-02 DIAGNOSIS — R0789 Other chest pain: Secondary | ICD-10-CM | POA: Diagnosis not present

## 2021-02-02 LAB — CBC WITH DIFFERENTIAL/PLATELET
Abs Immature Granulocytes: 0.03 10*3/uL (ref 0.00–0.07)
Basophils Absolute: 0 10*3/uL (ref 0.0–0.1)
Basophils Relative: 1 %
Eosinophils Absolute: 0.2 10*3/uL (ref 0.0–0.5)
Eosinophils Relative: 2 %
HCT: 45.6 % (ref 39.0–52.0)
Hemoglobin: 16.1 g/dL (ref 13.0–17.0)
Immature Granulocytes: 0 %
Lymphocytes Relative: 20 %
Lymphs Abs: 1.4 10*3/uL (ref 0.7–4.0)
MCH: 33.8 pg (ref 26.0–34.0)
MCHC: 35.3 g/dL (ref 30.0–36.0)
MCV: 95.6 fL (ref 80.0–100.0)
Monocytes Absolute: 0.5 10*3/uL (ref 0.1–1.0)
Monocytes Relative: 7 %
Neutro Abs: 4.9 10*3/uL (ref 1.7–7.7)
Neutrophils Relative %: 70 %
Platelets: 230 10*3/uL (ref 150–400)
RBC: 4.77 MIL/uL (ref 4.22–5.81)
RDW: 12.3 % (ref 11.5–15.5)
WBC: 7 10*3/uL (ref 4.0–10.5)
nRBC: 0 % (ref 0.0–0.2)

## 2021-02-02 LAB — COMPREHENSIVE METABOLIC PANEL
ALT: 31 U/L (ref 0–44)
AST: 22 U/L (ref 15–41)
Albumin: 4.6 g/dL (ref 3.5–5.0)
Alkaline Phosphatase: 67 U/L (ref 38–126)
Anion gap: 9 (ref 5–15)
BUN: 19 mg/dL (ref 8–23)
CO2: 26 mmol/L (ref 22–32)
Calcium: 9.7 mg/dL (ref 8.9–10.3)
Chloride: 105 mmol/L (ref 98–111)
Creatinine, Ser: 1.03 mg/dL (ref 0.61–1.24)
GFR, Estimated: >60 mL/min (ref >60)
Glucose, Bld: 179 mg/dL — ABNORMAL HIGH (ref 70–99)
Potassium: 4 mmol/L (ref 3.5–5.1)
Sodium: 140 mmol/L (ref 135–145)
Total Bilirubin: 0.9 mg/dL (ref 0.3–1.2)
Total Protein: 7.5 g/dL (ref 6.5–8.1)

## 2021-02-02 LAB — BRAIN NATRIURETIC PEPTIDE: B Natriuretic Peptide: 8.5 pg/mL (ref 0.0–100.0)

## 2021-02-02 LAB — TROPONIN I (HIGH SENSITIVITY)
Troponin I (High Sensitivity): 4 ng/L (ref <18)
Troponin I (High Sensitivity): 3 ng/L (ref <18)

## 2021-02-02 LAB — PROTIME-INR
INR: 1 (ref 0.8–1.2)
Prothrombin Time: 13 seconds (ref 11.4–15.2)

## 2021-02-02 NOTE — ED Provider Notes (Signed)
MSE was initiated and I personally evaluated the patient and placed orders (if any) at  5:57 AM on February 02, 2021.  Patient with history of HTN, significant FH CAD (father died age 63, brother with CABG), presents after onset chest pain, diaphoresis, nausea with vomiting. EMS was called, transported to ED.   Today's Vitals   02/02/21 0550  BP: 108/78  Pulse: 87  Resp: 16  Temp: 98.7 F (37.1 C)  TempSrc: Oral  SpO2: 97%   There is no height or weight on file to calculate BMI.  RRR Lungs clear Not diaphoretic now  Seen by Dr. Sedonia Small in triage.  The patient appears stable so that the remainder of the MSE may be completed by another provider.   Charlann Lange, PA-C 02/02/21 0603    Fatima Blank, MD 02/02/21 [phone]

## 2021-02-02 NOTE — ED Triage Notes (Signed)
Brought in by [facility] - [place] because feeling short of breath. Upon EMS arrival, pt felt cool and diaphoretic and was actively vomiting.     Spo2 initially was 90% on room air. Bp 110palp.   Chest pressure, scale 4/10, non radiating, center of chest.   ASA 324mg  given. Depression on inferior leads. G18 left hand.   Bp 150/100

## 2021-02-03 NOTE — Discharge Instructions (Addendum)

## 2021-02-03 NOTE — ED Provider Notes (Signed)
[facility] EMERGENCY DEPARTMENT Provider Note   CSN: [id number] Arrival date & time: 02/02/21  0544     History Chief Complaint  Patient presents with   Chest Pain   Shortness of Breath    Phillip [name] is a 65 y.o. male.  The history is provided by the patient.  Chest Pain Pain location:  Substernal area and epigastric Pain quality: pressure   Pain radiates to:  Does not radiate Pain severity:  Severe Onset quality:  Sudden Timing:  Constant Progression:  Resolved Chronicity:  New Relieved by:  Aspirin (Vomiting) Worsened by:  Nothing Associated symptoms: abdominal pain, diaphoresis, shortness of breath and vomiting   Associated symptoms: no cough, no fever and no syncope   Shortness of Breath Associated symptoms: abdominal pain, chest pain, diaphoresis and vomiting   Associated symptoms: no cough, no fever and no syncope    HPI: A 65 year old patient with a history of hypertension presents for evaluation of chest pain. Initial onset of pain was more than 6 hours ago. The patient's chest pain is described as heaviness/pressure/tightness and is not worse with exertion. The patient complains of nausea and reports some diaphoresis. The patient's chest pain is middle- or left-sided, is not well-localized, is not sharp and does not radiate to the arms/jaw/neck. The patient has a family history of coronary artery disease in a first-degree relative with onset less than age 41. The patient has no history of stroke, has no history of peripheral artery disease, has not smoked in the past 90 days, denies any history of treated diabetes, has no history of hypercholesterolemia and does not have an elevated BMI (>=30).  Patient reports this pain began at rest.  He was in his truck when this occurred.  He does not recall having the symptoms previously.  He reports it did feel better after vomiting He reports being very active and never has any chest pain or weakness No  previous history of CAD/VTE.  Past Medical History:  Diagnosis Date   Arthritis    fingers left little finger,4th joint after.   Blood pressure elevated    controls with diet and aspirin   Cancer ([place])    colon cancer dx.-surgery with 2 inches of rectum removed.only 5 yrs ago   Constipation    uses fiber to help regulate   Fracture, radius    left arm, age 71    Hypertension    PONV (postoperative nausea and vomiting)    Torn meniscus 08/02/2020   Fell on sleet/ice    There are no problems to display for this patient.   Past Surgical History:  Procedure Laterality Date   ANTERIOR CERVICAL DECOMP/DISCECTOMY FUSION Right    APPENDECTOMY     BACK SURGERY     rupture disc.   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)   COLONOSCOPY WITH PROPOFOL N/A 12/06/2014   Procedure: COLONOSCOPY WITH PROPOFOL;  Surgeon: [name], MD;  Location: [facility];  Service: Endoscopy;  Laterality: N/A;   FINGER SURGERY Left    left index finger"wart removal"   KNEE CARTILAGE SURGERY Right 09/23/2020   Repair torn Meniscus    NASAL RECONSTRUCTION     trauma repair   VASECTOMY         Family History  Problem Relation Age of Onset   Heart attack Mother    Hypotension Father     Social History   Tobacco Use   Smoking status: Former  Types: Cigarettes, Cigars, Pipe    Quit date: 11/30/1966    Years since quitting: 54.2   Smokeless tobacco: Former    Types: Chew   Tobacco comments:    Quit after teen yrs- no tobbacco use x15 yrs, Chewing tobacco x4 yrs ago  Substance Use Topics   Alcohol use: No   Drug use: No    Home Medications Prior to Admission medications   Medication Sig Start Date End Date Taking? Authorizing Provider  amLODipine (NORVASC) 5 MG tablet Take 5 mg by mouth daily. 08/30/20   [provider]  Ascorbic Acid (VITAMIN C PO) Take 1 tablet by mouth every morning.    [provider]  beta carotene w/minerals  (OCUVITE) tablet Take 1 tablet by mouth every morning.    [provider]  celecoxib (CELEBREX) 200 MG capsule TAKE 1 CAPSULE WITH FOOD ONCE A DAY IF NEEDED 06/19/20   [provider]  Cholecalciferol (VITAMIN D PO) Take 1 tablet by mouth every morning.    [provider]  fluticasone (FLONASE) 50 MCG/ACT nasal spray Place 1 spray into both nostrils daily as needed for allergies or rhinitis. Patient not taking: Reported on 10/14/2020    [provider]  ibuprofen (ADVIL,MOTRIN) 200 MG tablet Take 400 mg by mouth every 6 (six) hours as needed for headache or moderate pain. Patient not taking: Reported on 10/14/2020    [provider]  Multiple Vitamin (MULTIVITAMIN WITH MINERALS) TABS tablet Take 1 tablet by mouth every morning.    [provider]  omeprazole (PRILOSEC) 20 MG capsule Take 20 mg by mouth daily.    [provider]    Allergies    Penicillins and Sulfa antibiotics  Review of Systems   Review of Systems  Constitutional:  Positive for diaphoresis. Negative for fever.  Respiratory:  Positive for shortness of breath. Negative for cough.   Cardiovascular:  Positive for chest pain. Negative for syncope.  Gastrointestinal:  Positive for abdominal pain and vomiting.  Neurological:  Negative for syncope.  All other systems reviewed and are negative.  Physical Exam Updated Vital Signs BP (!) 151/82 (BP Location: Right Arm)   Pulse 72   Temp 98 F (36.7 C) (Oral)   Resp 20   SpO2 98%   Physical Exam CONSTITUTIONAL: Well developed/well nourished, well appearing HEAD: Normocephalic/atraumatic EYES: EOMI/PERRL ENMT: Mucous membranes moist NECK: supple no meningeal signs SPINE/BACK:entire spine nontender CV: S1/S2 noted, no murmurs/rubs/gallops noted LUNGS: Lungs are clear to auscultation bilaterally, no apparent distress ABDOMEN: soft, nontender, no rebound or guarding, bowel sounds noted throughout abdomen GU:no cva  tenderness NEURO: Pt is awake/alert/appropriate, moves all extremitiesx4.  No facial droop.   EXTREMITIES: pulses normal/equalx4, full ROM, no lower extremity SKIN: warm, color normal PSYCH: no abnormalities of mood noted, alert and oriented to situation  ED Results / Procedures / Treatments   Labs (all labs ordered are listed, but only abnormal results are displayed) Labs Reviewed  COMPREHENSIVE METABOLIC PANEL - Abnormal; Notable for the following components:      Result Value   Glucose, Bld 179 (*)    All other components within normal limits  CBC WITH DIFFERENTIAL/PLATELET  BRAIN NATRIURETIC PEPTIDE  PROTIME-INR  TROPONIN I (HIGH SENSITIVITY)  TROPONIN I (HIGH SENSITIVITY)    EKG EKG Interpretation  Date/Time:  Thursday February 02 2021 05:46:00 EDT Ventricular Rate:  94 PR Interval:  170 QRS Duration: 84 QT Interval:  320 QTC Calculation: 400 R Axis:   61 Text  Interpretation: Normal sinus rhythm T wave abnormality, consider inferior ischemia Abnormal ECG Confirmed by Gerlene Fee [phone]) on 02/02/2021 6:02:46 AM  Radiology DG Chest 2 View  Result Date: 02/02/2021 CLINICAL DATA:  Chest pain EXAM: CHEST - 2 VIEW COMPARISON:  04/10/2010 FINDINGS: Normal heart size and mediastinal contours. No acute infiltrate or edema. No effusion or pneumothorax. No acute osseous findings. IMPRESSION: No active cardiopulmonary disease. Electronically Signed   By: Monte Fantasia M.D.   On: 02/02/2021 07:00    Procedures Procedures   Medications Ordered in ED Medications - No data to display  ED Course  I have reviewed the triage vital signs and the nursing notes.  Pertinent labs & imaging results that were available during my care of the patient were reviewed by me and considered in my medical decision making (see chart for details).    MDM Rules/Calculators/A&P HEAR Score: 5                         Patient is very well-appearing.  By the time of my evaluation he had been in the  ER for over 17 hours His troponins were unremarkable.  I personally reviewed his chest x-ray it was negative.  I personally reviewed the EMS run sheet and prehospital EKG.  Pulse ox was 92 to 93% that improved.  He was not given nitroglycerin but was given aspirin.  He also had reported pain in the epigastric region. He reports he is essentially pain-free, but will have occasional brief pain in the ED without any other symptoms. When I reviewed an old EKG, there is similarities to the EKG performed on this visit Given his stability in the ED and his negative troponins, I feel he can be discharged home despite his heart score of 5.  However he would benefit from a close cardiology follow-up.  If he has recurrent epigastric pain, he may need to have biliary colic evaluation. Patient walked in  the ER without any hypoxia.  No recurrent hypoxia in the ER Final Clinical Impression(s) / ED Diagnoses Final diagnoses:  Precordial pain  Primary hypertension    Rx / DC Orders ED Discharge Orders     None        Ripley Fraise, MD 02/03/21 [phone]

## 2021-02-06 ENCOUNTER — Other Ambulatory Visit: Payer: Self-pay

## 2021-02-06 ENCOUNTER — Ambulatory Visit: Payer: BC Managed Care – PPO | Admitting: Cardiovascular Disease

## 2021-02-06 ENCOUNTER — Encounter: Payer: Self-pay | Admitting: Cardiovascular Disease

## 2021-02-06 DIAGNOSIS — R079 Chest pain, unspecified: Secondary | ICD-10-CM

## 2021-02-06 DIAGNOSIS — Z6834 Body mass index (BMI) 34.0-34.9, adult: Secondary | ICD-10-CM

## 2021-02-06 DIAGNOSIS — R1013 Epigastric pain: Secondary | ICD-10-CM | POA: Diagnosis not present

## 2021-02-06 DIAGNOSIS — E6609 Other obesity due to excess calories: Secondary | ICD-10-CM | POA: Diagnosis not present

## 2021-02-06 DIAGNOSIS — I10 Essential (primary) hypertension: Secondary | ICD-10-CM | POA: Diagnosis not present

## 2021-02-06 MED ORDER — METOPROLOL TARTRATE 100 MG PO TABS: 100.0000 mg | ORAL_TABLET | Freq: Once | ORAL | 0 refills | Status: DC

## 2021-02-06 NOTE — Progress Notes (Signed)
Cardiology Office Note    Date:  02/11/2021   ID:  Phillip [name], Phillip [name] 08-23-1955, MRN [id number]  PCP:  Lavone Orn, MD  Cardiologist:  Shelva Majestic, MD   Cardiology consultation referred through the courtesy of Dr. Ripley Fraise following an emergency room evaluation February 03, 2021.  History of Present Illness:  Phillip [name] is a 65 y.o. male who is followed by Dr. Lavone Orn for primary care.  He has a several year history of hypertension as well as GERD.  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  He remains active at work and previously denied any awareness of cardiac issues.  There is a family history for premature heart disease with his father dying at age 29 following a cardiac arrest.  In addition, a brother has undergone CABG revascularization surgery.  On the evening of July 21, he began to notice some pressure in his stomach as well as in his lower chest in addition to alongside his sternum.  He also began to notice some shortness of breath, became clammy, and started to hyperventilate.  He became nauseated vomited x3 with improvement in symptoms.  He ultimately presented to the emergency room and was given baby aspirin by EMS.  He apparently waited 17 hours in the emergency room before being brought back.  He ultimately was evaluated by Dr. Doylene Canard who felt the patient was well-appearing.  His troponins were unremarkable.  His chest x-ray was negative was negative.  He reviewed the EMS run sheet and prehospital ECG which were normal.  His pulse ox was 92 to 93%.  He was not given any sublingual nitroglycerin.  At the time of his evaluation he was pain-free he did not feel to have an ischemic etiology to his chest pain but he was discharged with recommendation to have early cardiology evaluation follow-up..  Presently, he feels well.  He denies any recurrent symptoms.  Retrospectively, at times he has noted some epigastric and abdominal bloating.  Recently has noticed  that these have improved with Gas-X pills.  He denies any exertionally precipitated chest tightness.  He is unaware of any significant episodes of tachycardia.  He has been on amlodipine 5 mg and valsartan 160 mg for hypertension.  He also is on omeprazole 20 mg for GERD.  He takes Celebrex as needed for musculoskeletal discomfort.  He presents for cardiology consultation.   Past Medical History:  Diagnosis Date   Arthritis    fingers left little finger,4th joint after.   Blood pressure elevated    controls with diet and aspirin   Cancer ([place])    colon cancer dx.-surgery with 2 inches of rectum removed.only 5 yrs ago   Constipation    uses fiber to help regulate   Fracture, radius    left arm, age 61    Hypertension    PONV (postoperative nausea and vomiting)    Torn meniscus 08/02/2020   [place] on sleet/ice    Past Surgical History:  Procedure Laterality Date   ANTERIOR CERVICAL DECOMP/DISCECTOMY FUSION Right    APPENDECTOMY     BACK SURGERY     rupture disc.   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 Patient takes 2 tablets     amLODipine (NORVASC) 5 MG tablet Take 5 mg by mouth daily.     APPLE CIDER VINEGAR PO Take 500 mg by mouth daily.     Ascorbic Acid (VITAMIN C PO) Take 1,000 mg by mouth every morning.     B Complex-Biotin-FA (SUPER B-50 COMPLEX PO) Take 1,000 mcg by mouth daily.     celecoxib (CELEBREX) 200 MG capsule  TAKE 1 CAPSULE WITH FOOD ONCE A DAY IF NEEDED     Cholecalciferol (VITAMIN D PO) Take 1 tablet by mouth every morning.     cyclobenzaprine (FLEXERIL) 10 MG tablet Take 10 mg by mouth as needed for muscle spasms.     Multiple Vitamin (MULTIVITAMIN WITH MINERALS) TABS tablet Take 1 tablet by mouth every morning.     omeprazole (PRILOSEC) 20 MG capsule Take 20 mg by mouth daily.     OVER THE COUNTER MEDICATION Take 1 tablet by mouth daily. Nervive     valsartan (DIOVAN) 160 MG tablet Take 160 mg by mouth daily.     vitamin E 180 MG (400 UNITS) capsule Take 400 Units by mouth daily.     Zinc 50 MG TABS Take 50 mg by mouth daily.     beta carotene w/minerals (OCUVITE) tablet Take 1 tablet by mouth every morning. (Patient not taking: Reported on 02/06/2021)     fluticasone (FLONASE) 50 MCG/ACT nasal spray Place 1 spray into both nostrils daily as needed for allergies or rhinitis. (Patient not taking: No sig reported)     ibuprofen (ADVIL,MOTRIN) 200 MG tablet Take 400 mg by mouth every 6 (six) hours as needed for headache or moderate pain. (Patient not taking: No sig reported)     No facility-administered medications prior to visit.     Allergies:   Penicillins and Sulfa antibiotics   Social History   Socioeconomic History   Marital status: Married    Spouse name: Manjot Hinks   Number of children: 2   Years of education: Not on file   Highest education level: Not on file  Occupational History   Not on file  Tobacco Use   Smoking status: Former    Types: Cigarettes, Cigars, Pipe    Quit date: 11/30/1966    Years since quitting: 54.2   Smokeless tobacco: Former    Types: Chew   Tobacco comments:    Quit after teen yrs- no tobbacco use x15 yrs, Chewing tobacco x4 yrs ago  Substance and Sexual Activity   Alcohol use: No   Drug use: No   Sexual activity: Not on file  Other Topics Concern   Not on file  Social History Narrative   Both left and right handed    Lives in a one story home  with a basement   Drinks caffeine    Social Determinants of Health   Financial Resource Strain: Not on file  Food Insecurity: Not on file  Transportation Needs: Not on file  Physical Activity: Not on file  Stress: Not on file  Social Connections: Not on file    Socially he is married for 22 years.  He has 2 children.  He  refers to his wife as "Geologist, engineering. "  There is no tobacco or alcohol history.  He does not exercise regularly but works long hours.  He works as a Administrator for [employer].  He completed 12 grade of education.   Family History:  The patient's family history includes Heart attack in his mother; Hypotension in his father.   ROS General: Negative; No fevers, chills, or night sweats;  HEENT: Negative; No changes in vision or hearing, sinus congestion, difficulty swallowing Pulmonary: Negative; No cough, wheezing, shortness of breath, hemoptysis Cardiovascular: Negative; No chest pain, presyncope, syncope, palpitations GI: Negative; No nausea, vomiting, diarrhea, or abdominal pain GU: Negative; No dysuria, hematuria, or difficulty voiding Musculoskeletal: History of right knee torn meniscus; cervical C5-C6-C7 neck disease Hematologic/Oncology: History of colon CA, status post partial bowel resection Endocrine: Negative; no heat/cold intolerance; no diabetes Neuro: Negative; no changes in balance, headaches Skin: Negative; No rashes or skin lesions Psychiatric: Negative; No behavioral problems, depression Sleep: Negative; No snoring, daytime sleepiness, hypersomnolence, bruxism, restless legs, hypnogognic hallucinations, no cataplexy Other comprehensive 14 point system review is negative.   PHYSICAL EXAM:   VS:  BP (!) 148/82 (BP Location: Left Arm, Patient Position: Sitting, Cuff Size: Large)   Pulse 68   Ht 6' (1.829 m)   Wt 257 lb 3.2 oz (116.7 kg)   BMI 34.88 kg/m     Repeat blood pressure by me 128/78  Wt Readings from Last 3  Encounters:  02/06/21 257 lb 3.2 oz (116.7 kg)  10/14/20 254 lb (115.2 kg)  12/06/14 255 lb (115.7 kg)    General: Alert, oriented, no distress.  Skin: normal turgor, no rashes, warm and dry HEENT: Normocephalic, atraumatic. Pupils equal round and reactive to light; sclera anicteric; extraocular muscles intact;  Nose without nasal septal hypertrophy Mouth/Parynx benign; Mallinpatti scale 3 Neck: No JVD, no carotid bruits; normal carotid upstroke Lungs: clear to ausculatation and percussion; no wheezing or rales Chest wall: Mild costochondral tenderness Heart: PMI not displaced, RRR, s1 s2 normal, 1/6 systolic murmur, no diastolic murmur, no rubs, gallops, thrills, or heaves Abdomen: soft, nontender; no hepatosplenomehaly, BS+; abdominal aorta nontender and not dilated by palpation. Back: no CVA tenderness Pulses 2+ Musculoskeletal: full range of motion, normal strength, no joint deformities Extremities: no clubbing cyanosis or edema, Homan's sign negative  Neurologic: grossly nonfocal; Cranial nerves grossly wnl Psychologic: Normal mood and affect   Studies/Labs Reviewed:   ECG (independently read by me): Normal sinus rhythm at 68 bpm.  Q-wave present in lead III with nondiagnostic Q-wave in aVF.  Nondiagnostic T wave abnormality in lead III and aVF.  I personally reviewed the EKG from February 02, 2021: Normal sinus rhythm at 94 bpm.  T wave abnormality in lead III and aVF with nondiagnostic Q-wave in lead III.  Recent Labs: BMP Latest Ref Rng & Units 02/10/2021 02/02/2021 04/28/2009  Glucose 65 - 99 mg/dL 132(H) 179(H) 114(H)  BUN 8 - 27 mg/dL _0 Creatinine 0.76 - 1.27 mg/dL 0.96 1.03 1.10  BUN/Creat Ratio 10 - 24 20 - -  Sodium 134 - 144 mmol/L 143 140 141  Potassium 3.5 - 5.2 mmol/L 4.8 4.0 5.1  Chloride 96 - 106 mmol/L 105 105 103  CO2 20 - 29 mmol/L _1 Calcium 8.6 - 10.2 mg/dL 9.5 9.7 9.7     Hepatic Function Latest Ref Rng & Units 02/10/2021 02/02/2021  04/28/2009  Total Protein 6.0 - 8.5 g/dL 7.2  7.5 7.5  Albumin 3.8 - 4.8 g/dL 4.7 4.6 4.8  AST 0 - 40 IU/L _0 ALT 0 - 44 IU/L _1 Alk Phosphatase 44 - 121 IU/L 78 67 72  Total Bilirubin 0.0 - 1.2 mg/dL 0.7 0.9 0.8    CBC Latest Ref Rng & Units 02/10/2021 02/02/2021 04/28/2009  WBC 3.4 - 10.8 x10E3/uL 5.1 7.0 6.1  Hemoglobin 13.0 - 17.7 g/dL 15.2 16.1 14.6  Hematocrit 37.5 - 51.0 % 42.7 45.6 41.2  Platelets 150 - 450 x10E3/uL 222 230 301   Lab Results  Component Value Date   MCV 93 02/10/2021   MCV 95.6 02/02/2021   MCV 93.3 04/28/2009   Lab Results  Component Value Date   TSH 0.614 02/10/2021   No results found for: HGBA1C   BNP    Component Value Date/Time   BNP 8.5 02/02/2021 0617    ProBNP No results found for: PROBNP   Lipid Panel     Component Value Date/Time   CHOL 171 02/10/2021 0952   TRIG 148 02/10/2021 0952   HDL 36 (L) 02/10/2021 0952   CHOLHDL 4.8 02/10/2021 0952   LDLCALC 109 (H) 02/10/2021 0952   LABVLDL 26 02/10/2021 0952     RADIOLOGY: DG Chest 2 View  Result Date: 02/02/2021 CLINICAL DATA:  Chest pain EXAM: CHEST - 2 VIEW COMPARISON:  04/10/2010 FINDINGS: Normal heart size and mediastinal contours. No acute infiltrate or edema. No effusion or pneumothorax. No acute osseous findings. IMPRESSION: No active cardiopulmonary disease. Electronically Signed   By: Monte Fantasia M.D.   On: 02/02/2021 07:00     Additional studies/ records that were reviewed today include:   I reviewed the records of Dr. Christy Gentles from his February 02, 2021 [facility] evaluation.   ASSESSMENT:    1. Chest pain, unspecified type   2. Epigastric pain   3. Essential hypertension   4. Class 1 obesity due to excess calories without serious comorbidity with body mass index (BMI) of 34.0 to 34.9 in adult      PLAN:  Phillip [name] is a 65 year old gentleman who has a history of hypertension and has been treated with amlodipine 5  mg in addition to valsartan 160 mg daily.  He also has history of GERD.  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  He denies any exertional chest pain.  He does not routinely exercise but works long hours.  At times he has noticed some mild shortness of breath.  He presented to [facility] after developing some pressure in his upper epigastric abdomen and vague pressure in his chest.  He had some tenderness alongside his breastbone bilaterally.  He became somewhat diaphoretic, began to hyperventilate, and then became nauseated and vomited with ultimate resolution of his symptomatology.  His ECG shows T wave abnormality in leads III and aVF.  His troponins were negative.  He denies any recurrent symptomatology.  There was some mild tenderness along the costochondral region.  He also admits to abdominal bloating and this seems to have improved with Gas-X pills.  Presently, I am recommending he undergo a 2D echo Doppler study to evaluate systolic and diastolic function.  I am scheduling him for fasting laboratory with a comprehensive metabolic panel, CBC, TSH and lipid studies.  Cardiac CT imaging was also discussed.  His BMI is 34.88 consistent with obesity.  I will see him  in the office in 6 weeks for follow-up evaluation or sooner as needed.  Medication Adjustments/Labs and Tests Ordered: Current medicines are reviewed at length with the patient today.  Concerns regarding medicines are outlined above.  Medication changes, Labs and Tests ordered today are listed in the Patient Instructions below. Patient Instructions  Medication Instructions:  Take 132m of metoprolol tartrate (1 tablet) ONCE 2 hours before your cardiac CT.   *If you need a refill on your cardiac medications before your next appointment, please call your pharmacy*   Lab Work: FASTING labs - CMET, CBC, TSH, Lipid  If you have labs (blood work) drawn today and your tests are  completely normal, you will receive your results only by: [place](if you have MyChart) OR A paper copy in the mail If you have any lab test that is abnormal or we need to change your treatment, we will call you to review the results.   Testing/Procedures: Your physician has requested that you have an echocardiogram. Echocardiography is a painless test that uses sound waves to create images of your heart. It provides your doctor with information about the size and shape of your heart and how well your heart's chambers and valves are working. This procedure takes approximately one hour. There are no restrictions for this procedure.     Your cardiac CT will be scheduled at one of the below locations:   [facility] [place] [phone]  [facility] minutes prior to test start time. Pro[facility] to the [facility] (first floor) to check-in and test prep.   Please follow these instructions carefully (unless otherwise directed):  Hold all erectile dysfunction medications at least 3 days (72 hrs) prior to test.  On the Night Before the Test: Be sure to Drink plenty of water. Do not consume any caffeinated/decaffeinated beverages or chocolate 12 hours prior to your test. Do not take any antihistamines 12 hours prior to your test.  On the Day of the Test: Drink plenty of water until 1 hour prior to the test. Do not eat any food 4 hours prior to the test. You may take your regular medications prior to the test.  Take metoprolol (Lopressor) two hours prior to test. HOLD Furosemide/Hydrochlorothiazide morning of the test.        After the Test: Drink plenty of water. After receiving IV contrast, you may experience a mild flushed feeling. This is normal. On occasion, you may experience a mild rash up to  24 hours after the test. This is not dangerous. If this occurs, you can take Benadryl 25 mg and increase your fluid intake. If you experience trouble breathing, this can be serious. If it is severe call 911 IMMEDIATELY. If it is mild, please call our office. If you take any of these medications: Glipizide/Metformin, Avandament, Glucavance, please do not take 48 hours after completing test unless otherwise instructed.  Please allow 2-4 weeks for scheduling of routine cardiac CTs. Some insurance companies require a pre-authorization which may delay scheduling of this test.   For non-scheduling related questions, please contact the cardiac imaging nurse navigator should you have any questions/concerns: SMarchia Bond Cardiac Imaging Nurse Navigator MGordy Clement Cardiac Imaging Nurse Navigator [place] [facility] Direct Office Dial: [phone]  For scheduling needs, including cancellations and rescheduling, please call [place]  [phone].      Follow-Up: At [facility], you and your health needs are our priority.  As part of our continuing mission to provide you with exceptional heart care, we have created designated Provider Care Teams.  These Care Teams include your primary Cardiologist (physician) and Advanced Practice Providers (APPs -  Physician Assistants and Nurse Practitioners) who all work together to provide you with the care you need, when you need it.  We recommend signing up for the patient portal called "MyChart".  Sign up information is provided on this After Visit Summary.  MyChart is used to connect with patients for Virtual Visits (Telemedicine).  Patients are able to view lab/test results, encounter notes, upcoming appointments, etc.  Non-urgent messages can be sent to your provider as well.   To learn more about what you can do with MyChart, go to [email or web address].    Your next appointment:   6 week(s)  The format for your next  appointment:   In Person  Provider:   Shelva Majestic, MD      Signed, Shelva Majestic, MD  02/11/2021 4:49 PM    [place] [address] [place], [place], [place]  [address] Phone: [phone]

## 2021-02-06 NOTE — Patient Instructions (Addendum)
Medication Instructions:  Take '100mg'$  of metoprolol tartrate (1 tablet) ONCE 2 hours before your cardiac CT.   *If you need a refill on your cardiac medications before your next appointment, please call your pharmacy*   Lab Work: FASTING labs - CMET, CBC, TSH, Lipid  If you have labs (blood work) drawn today and your tests are completely normal, you will receive your results only by: [place] (if you have MyChart) OR A paper copy in the mail If you have any lab test that is abnormal or we need to change your treatment, we will call you to review the results.   Testing/Procedures: Your physician has requested that you have an echocardiogram. Echocardiography is a painless test that uses sound waves to create images of your heart. It provides your doctor with information about the size and shape of your heart and how well your heart's chambers and valves are working. This procedure takes approximately one hour. There are no restrictions for this procedure.     Your cardiac CT will be scheduled at one of the below locations:   [facility] [address] [place], [place] [address] [phone]   If scheduled at [facility], please arrive at the [facility] main entrance (entrance A) of [facility] 30 minutes prior to test start time. Proceed to the [facility] Radiology Department (first floor) to check-in and test prep.   Please follow these instructions carefully (unless otherwise directed):  Hold all erectile dysfunction medications at least 3 days (72 hrs) prior to test.  On the Night Before the Test: Be sure to Drink plenty of water. Do not consume any caffeinated/decaffeinated beverages or chocolate 12 hours prior to your test. Do not take any antihistamines 12 hours prior to your test.  On the Day of the Test: Drink plenty of water until 1 hour prior to the test. Do not eat any food 4 hours prior to the test. You may take your regular  medications prior to the test.  Take metoprolol (Lopressor) two hours prior to test. HOLD Furosemide/Hydrochlorothiazide morning of the test.        After the Test: Drink plenty of water. After receiving IV contrast, you may experience a mild flushed feeling. This is normal. On occasion, you may experience a mild rash up to 24 hours after the test. This is not dangerous. If this occurs, you can take Benadryl 25 mg and increase your fluid intake. If you experience trouble breathing, this can be serious. If it is severe call 911 IMMEDIATELY. If it is mild, please call our office. If you take any of these medications: Glipizide/Metformin, Avandament, Glucavance, please do not take 48 hours after completing test unless otherwise instructed.  Please allow 2-4 weeks for scheduling of routine cardiac CTs. Some insurance companies require a pre-authorization which may delay scheduling of this test.   For non-scheduling related questions, please contact the cardiac imaging nurse navigator should you have any questions/concerns: Marchia Bond, Cardiac Imaging Nurse Navigator Gordy Clement, Cardiac Imaging Nurse Navigator [place] [facility] Direct Office Dial: [phone]   For scheduling needs, including cancellations and rescheduling, please call [place], [phone].      Follow-Up: At [facility], you and your health needs are our priority.  As part of our continuing mission to provide you with exceptional heart care, we have created designated Provider Care Teams.  These Care Teams include your primary Cardiologist (physician) and Advanced Practice Providers (APPs -  Physician Assistants and Nurse Practitioners) who all work together to provide you with the care you need, when you need it.  We recommend signing up for the patient portal called "MyChart".  Sign up information is provided on this After Visit Summary.  MyChart is used to connect with patients for  Virtual Visits (Telemedicine).  Patients are able to view lab/test results, encounter notes, upcoming appointments, etc.  Non-urgent messages can be sent to your provider as well.   To learn more about what you can do with MyChart, go to [email or web address].    Your next appointment:   6 week(s)  The format for your next appointment:   In Person  Provider:   Shelva Majestic, MD

## 2021-02-10 ENCOUNTER — Telehealth (HOSPITAL_COMMUNITY): Payer: Self-pay | Admitting: Emergency Medicine

## 2021-02-10 DIAGNOSIS — R1013 Epigastric pain: Secondary | ICD-10-CM | POA: Diagnosis not present

## 2021-02-10 DIAGNOSIS — R079 Chest pain, unspecified: Secondary | ICD-10-CM | POA: Diagnosis not present

## 2021-02-10 DIAGNOSIS — E6609 Other obesity due to excess calories: Secondary | ICD-10-CM | POA: Diagnosis not present

## 2021-02-10 DIAGNOSIS — I10 Essential (primary) hypertension: Secondary | ICD-10-CM | POA: Diagnosis not present

## 2021-02-10 LAB — COMPREHENSIVE METABOLIC PANEL
ALT: 30 IU/L (ref 0–44)
AST: 21 IU/L (ref 0–40)
Albumin/Globulin Ratio: 1.9 (ref 1.2–2.2)
Albumin: 4.7 g/dL (ref 3.8–4.8)
Alkaline Phosphatase: 78 IU/L (ref 44–121)
BUN/Creatinine Ratio: 20 (ref 10–24)
BUN: 19 mg/dL (ref 8–27)
Bilirubin Total: 0.7 mg/dL (ref 0.0–1.2)
CO2: 25 mmol/L (ref 20–29)
Calcium: 9.5 mg/dL (ref 8.6–10.2)
Chloride: 105 mmol/L (ref 96–106)
Creatinine, Ser: 0.96 mg/dL (ref 0.76–1.27)
Globulin, Total: 2.5 g/dL (ref 1.5–4.5)
Glucose: 132 mg/dL — ABNORMAL HIGH (ref 65–99)
Potassium: 4.8 mmol/L (ref 3.5–5.2)
Sodium: 143 mmol/L (ref 134–144)
Total Protein: 7.2 g/dL (ref 6.0–8.5)
eGFR: 89 mL/min/{1.73_m2} (ref >59)

## 2021-02-10 LAB — LIPID PANEL
Chol/HDL Ratio: 4.8 ratio (ref 0.0–5.0)
Cholesterol, Total: 171 mg/dL (ref 100–199)
HDL: 36 mg/dL — ABNORMAL LOW (ref >39)
LDL Chol Calc (NIH): 109 mg/dL — ABNORMAL HIGH (ref 0–99)
Triglycerides: 148 mg/dL (ref 0–149)
VLDL Cholesterol Cal: 26 mg/dL (ref 5–40)

## 2021-02-10 LAB — CBC
Hematocrit: 42.7 % (ref 37.5–51.0)
Hemoglobin: 15.2 g/dL (ref 13.0–17.7)
MCH: 33 pg (ref 26.6–33.0)
MCHC: 35.6 g/dL (ref 31.5–35.7)
MCV: 93 fL (ref 79–97)
Platelets: 222 10*3/uL (ref 150–450)
RBC: 4.61 x10E6/uL (ref 4.14–5.80)
RDW: 11.8 % (ref 11.6–15.4)
WBC: 5.1 10*3/uL (ref 3.4–10.8)

## 2021-02-10 LAB — TSH: TSH: 0.614 u[IU]/mL (ref 0.450–4.500)

## 2021-02-10 NOTE — Telephone Encounter (Signed)
Reaching out to patient to offer assistance regarding upcoming cardiac imaging study; pt verbalizes understanding of appt date/time, parking situation and where to check in, pre-test NPO status and medications ordered, and verified current allergies; name and call back number provided for further questions should they arise Marchia Bond RN Navigator Cardiac Imaging [place] [facility] [phone] office [phone] cell  Denies iv issues Some claustro- tolerates CT scan '100mg'$  metoprolol tart 2 hr prior to scan Clarise Cruz

## 2021-02-11 ENCOUNTER — Encounter: Payer: Self-pay | Admitting: Cardiovascular Disease

## 2021-02-14 ENCOUNTER — Ambulatory Visit (HOSPITAL_COMMUNITY)
Admission: RE | Admit: 2021-02-14 | Discharge: 2021-02-14 | Disposition: A | Discharge status: Home / Self Care | Payer: BC Managed Care – PPO | Source: Ambulatory Visit | Attending: Cardiovascular Disease | Admitting: Cardiovascular Disease

## 2021-02-14 ENCOUNTER — Other Ambulatory Visit: Payer: Self-pay

## 2021-02-14 DIAGNOSIS — R079 Chest pain, unspecified: Secondary | ICD-10-CM | POA: Insufficient documentation

## 2021-02-14 MED ORDER — IOHEXOL 350 MG/ML SOLN
95.0000 mL | Freq: Once | INTRAVENOUS | Status: AC | PRN
  Administered 2021-02-14: 95 mL via INTRAVENOUS

## 2021-02-14 MED ORDER — NITROGLYCERIN 0.4 MG SL SUBL
SUBLINGUAL_TABLET | SUBLINGUAL | Status: AC
  Filled 2021-02-14: qty 2

## 2021-02-14 MED ORDER — NITROGLYCERIN 0.4 MG SL SUBL
0.8000 mg | SUBLINGUAL_TABLET | Freq: Once | SUBLINGUAL | Status: AC
  Administered 2021-02-14: 0.8 mg via SUBLINGUAL

## 2021-03-03 ENCOUNTER — Other Ambulatory Visit: Payer: Self-pay

## 2021-03-03 ENCOUNTER — Ambulatory Visit (HOSPITAL_COMMUNITY): Payer: BC Managed Care – PPO | Attending: Internal Medicine

## 2021-03-03 DIAGNOSIS — R079 Chest pain, unspecified: Secondary | ICD-10-CM

## 2021-03-03 LAB — ECHOCARDIOGRAM COMPLETE
Area-P 1/2: 4.17 cm2
S' Lateral: 2.6 cm

## 2021-03-09 ENCOUNTER — Telehealth: Payer: Self-pay | Admitting: Cardiovascular Disease

## 2021-03-09 NOTE — Telephone Encounter (Signed)
Patient was returning call 

## 2021-03-09 NOTE — Telephone Encounter (Signed)
Phillip Beaver, LPN  [id number]  [id number] AM EDT     Called patient to discuss ECHO results.  Patient did not answer, LVM to call back.    Troy Sine, MD  03/05/2021  2:55 PM EDT     Hyperdynamic LV function with EF 65 to 70%.  Moderate LVH.  Grade 1 diastolic dysfunction.  Borderline dilation of aortic root at 38 mm with mild dilation of ascending aorta at 42 mm.    Patient called w/results Reviewed with patient cardiac CT results and lab results Encourage diet low in saturated fats, fried foods.  Advised lipid-lowering medication may need to be prescribed given LDL of 109 and calcium on CT test  Advised him message will be sent to MD to review  He has appointment 04/06/21

## 2021-03-09 NOTE — Telephone Encounter (Signed)
Pt is returning call for results. Please advise pt further

## 2021-03-16 NOTE — Telephone Encounter (Signed)
Visit on 09/22 will discuss at visit per Dr.Kelly.

## 2021-04-04 DIAGNOSIS — K529 Noninfective gastroenteritis and colitis, unspecified: Secondary | ICD-10-CM | POA: Diagnosis not present

## 2021-04-04 DIAGNOSIS — R110 Nausea: Secondary | ICD-10-CM | POA: Diagnosis not present

## 2021-04-06 ENCOUNTER — Encounter: Payer: Self-pay | Admitting: Cardiovascular Disease

## 2021-04-06 ENCOUNTER — Other Ambulatory Visit: Payer: Self-pay

## 2021-04-06 ENCOUNTER — Ambulatory Visit: Payer: BC Managed Care – PPO | Admitting: Cardiovascular Disease

## 2021-04-06 DIAGNOSIS — R079 Chest pain, unspecified: Secondary | ICD-10-CM

## 2021-04-06 DIAGNOSIS — K219 Gastro-esophageal reflux disease without esophagitis: Secondary | ICD-10-CM

## 2021-04-06 DIAGNOSIS — E6609 Other obesity due to excess calories: Secondary | ICD-10-CM

## 2021-04-06 DIAGNOSIS — I2510 Atherosclerotic heart disease of native coronary artery without angina pectoris: Secondary | ICD-10-CM | POA: Diagnosis not present

## 2021-04-06 DIAGNOSIS — I10 Essential (primary) hypertension: Secondary | ICD-10-CM

## 2021-04-06 DIAGNOSIS — R931 Abnormal findings on diagnostic imaging of heart and coronary circulation: Secondary | ICD-10-CM | POA: Diagnosis not present

## 2021-04-06 DIAGNOSIS — Z6834 Body mass index (BMI) 34.0-34.9, adult: Secondary | ICD-10-CM

## 2021-04-06 DIAGNOSIS — I77810 Thoracic aortic ectasia: Secondary | ICD-10-CM

## 2021-04-06 DIAGNOSIS — I2584 Coronary atherosclerosis due to calcified coronary lesion: Secondary | ICD-10-CM

## 2021-04-06 MED ORDER — ROSUVASTATIN CALCIUM 10 MG PO TABS: 10.0000 mg | ORAL_TABLET | Freq: Every day | ORAL | 3 refills | Status: DC

## 2021-04-06 NOTE — Progress Notes (Signed)
Cardiology Office Note    Date:  04/07/2021   ID:  Phillip [name], Phillip [name] [birth year], MRN [id number]  PCP:  Lavone Orn, MD  Cardiologist:  Shelva Majestic, MD   2 month F/U initially referred through the courtesy of Dr. Ripley Fraise following an emergency room evaluation February 03, 2021.  History of Present Illness:  Phillip [name] is a 65 y.o. male who is followed by Dr. Lavone Orn for primary care.  He has a several year history of hypertension as well as GERD.  I saw him for initial evaluation on February 06, 2021.  He presents for follow-up evaluation.  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  He remains active at work and previously denied any awareness of cardiac issues.  There is a family history for premature heart disease with his father dying at age 49 following a cardiac arrest.  In addition, a brother has undergone CABG revascularization surgery.  On the evening of July 21, he began to notice some pressure in his stomach as well as in his lower chest in addition to alongside his sternum.  He also began to notice some shortness of breath, became clammy, and started to hyperventilate.  He became nauseated vomited x3 with improvement in symptoms.  He ultimately presented to the emergency room and was given baby aspirin by EMS.  He apparently waited 17 hours in the emergency room before being brought back.  He ultimately was evaluated by Dr. Doylene Canard who felt the patient was well-appearing.  His troponins were unremarkable.  His chest x-ray was negative was negative.  He reviewed the EMS run sheet and prehospital ECG which were normal.  His pulse ox was 92 to 93%.  He was not given any sublingual nitroglycerin.  At the time of his evaluation he was pain-free he did not feel to have an ischemic etiology to his chest pain but he was discharged with recommendation to have early cardiology evaluation follow-up..  I saw him for initial evaluation he felt well and denied any recurrent  symptomatology.    Retrospectively, at times he has noted some epigastric and abdominal bloating.  Recently has noticed that these have improved with Gas-X pills.  He denied any exertionally precipitated chest tightness.  He was unaware of any significant episodes of tachycardia.  He has been on amlodipine 5 mg and valsartan 160 mg for hypertension and omeprazole 20 mg for GERD.  He takes Celebrex as needed for musculoskeletal discomfort.    During my initial evaluation, I did not feel that his symptoms were entirely ischemic in origin.  He had mild tenderness along the costochondral region and also admitted to abdominal bloating which improved with Gas-X pills.  I recommended he undergo a 2D echo Doppler study, scheduled him for fasting laboratory and also recommended cardiac CT imaging.  A 2D echo Doppler study in March 03, 2021 which revealed hyperdynamic LV function with EF at 65 to 70%.  There was grade 1 diastolic dysfunction.  There was mild dilation of his ascending aorta at 42 mm.  Laboratory revealed a total cholesterol 171, triglycerides 148, HDL 36, and LDL 109.  He had normal chemistry with exception of elevated glucose at 132.  CBC was stable.  CT coronary angiography revealed a calcium score of 75, placing him at the 55th percentile for age and sex matched controls.  He had mild scattered calcific proximal LAD plaque of 0 to 24%.   Presently, he feels well.  He denies any recurrent chest pain or prior symptomatology.  He presents for reevaluation.  Past Medical History:  Diagnosis Date   Arthritis    fingers left little finger,4th joint after.   Blood pressure elevated    controls with diet and aspirin   Cancer ([place])    colon cancer dx.-surgery with 2 inches of rectum removed.only 5 yrs ago   Constipation    uses fiber to help regulate   Fracture, radius    left arm, age 62    Hypertension    PONV (postoperative nausea and vomiting)    Torn meniscus 08/02/2020   [place] on  sleet/ice    Past Surgical History:  Procedure Laterality Date   ANTERIOR CERVICAL DECOMP/DISCECTOMY FUSION Right    APPENDECTOMY     BACK SURGERY     rupture disc.   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 Patient takes 2 tablets     Ascorbic Acid (VITAMIN C PO) Take 1,000 mg by mouth every morning.     B Complex-Biotin-FA (SUPER B-50 COMPLEX PO) Take 1,000 mcg by mouth daily.     beta carotene w/minerals (OCUVITE) tablet Take 1 tablet by mouth every morning.     Cholecalciferol (VITAMIN D PO) Take 1 tablet by mouth every morning.     cyclobenzaprine (FLEXERIL) 10 MG tablet Take 10 mg by mouth as needed for muscle spasms.     fluticasone (FLONASE) 50 MCG/ACT nasal spray Place 1 spray into both nostrils daily as needed for allergies or rhinitis.     ibuprofen (ADVIL,MOTRIN) 200 MG tablet Take 400 mg by mouth every 6 (six) hours as needed for headache or moderate pain.     Multiple Vitamin (MULTIVITAMIN WITH MINERALS) TABS tablet Take 1 tablet by mouth every morning.     omeprazole (PRILOSEC) 20 MG capsule Take 20 mg by mouth daily.     OVER THE COUNTER MEDICATION Take 1 tablet by mouth daily. Nervive     valsartan (DIOVAN) 160 MG tablet Take 160 mg by mouth daily.     vitamin E 180 MG (400 UNITS) capsule Take 400 Units by mouth daily.     Zinc 50 MG TABS Take 50 mg by mouth daily.     amLODipine (NORVASC) 5 MG tablet Take 5 mg by mouth  daily. (Patient not taking: Reported on 04/06/2021)     APPLE CIDER VINEGAR PO Take 500 mg by mouth daily. (Patient not taking: Reported on 04/06/2021)     celecoxib (CELEBREX) 200 MG capsule TAKE 1 CAPSULE WITH FOOD ONCE A DAY IF NEEDED (Patient not taking: Reported on 04/06/2021)     metoprolol tartrate (LOPRESSOR) 100 MG tablet Take 1 tablet (100 mg total) by mouth once for 1 dose. 2 hours prior to cardiac CT. 1 tablet 0   No facility-administered medications prior to visit.     Allergies:   Penicillins and Sulfa antibiotics  Social History   Socioeconomic History   Marital status: Married    Spouse name: Giovani Neumeister   Number of children: 2   Years of education: Not on file   Highest education level: Not on file  Occupational History   Not on file  Tobacco Use   Smoking status: Former    Types: Cigarettes, Cigars, Pipe    Quit date: 11/30/1966    Years since quitting: 36.3   Smokeless tobacco: Former    Types: Chew   Tobacco comments:    Quit after teen yrs- no tobbacco use x15 yrs, Chewing tobacco x4 yrs ago  Substance and Sexual Activity   Alcohol use: No   Drug use: No   Sexual activity: Not on file  Other Topics Concern   Not on file  Social History Narrative   Both left and right handed    Lives in a one story home with a basement   Drinks caffeine    Social Determinants of Health   Financial Resource Strain: Not on file  Food Insecurity: Not on file  Transportation Needs: Not on file  Physical Activity: Not on file  Stress: Not on file  Social Connections: Not on file    Socially he is married for 22 years.  He has 2 children.  He refers to his wife as "Geologist, engineering. "  There is no tobacco or alcohol history.  He does not exercise regularly but works long hours.  He works as a Administrator for [employer].  He completed 12 grade of education.   Family History:  The patient's family history includes Heart attack in his mother; Hypotension  in his father.   ROS General: Negative; No fevers, chills, or night sweats;  HEENT: Negative; No changes in vision or hearing, sinus congestion, difficulty swallowing Pulmonary: Negative; No cough, wheezing, shortness of breath, hemoptysis Cardiovascular: Negative; No chest pain, presyncope, syncope, palpitations GI: Negative; No nausea, vomiting, diarrhea, or abdominal pain GU: Negative; No dysuria, hematuria, or difficulty voiding Musculoskeletal: History of right knee torn meniscus; cervical C5-C6-C7 neck disease Hematologic/Oncology: History of colon CA, status post partial bowel resection Endocrine: Negative; no heat/cold intolerance; no diabetes Neuro: Negative; no changes in balance, headaches Skin: Negative; No rashes or skin lesions Psychiatric: Negative; No behavioral problems, depression Sleep: Negative; No snoring, daytime sleepiness, hypersomnolence, bruxism, restless legs, hypnogognic hallucinations, no cataplexy Other comprehensive 14 point system review is negative.   PHYSICAL EXAM:   VS:  BP 126/70 (BP Location: Left Arm, Patient Position: Sitting, Cuff Size: Normal)   Pulse 72   Resp 20   Ht 6' 1"  (1.854 m)   Wt 252 lb 12.8 oz (114.7 kg)   SpO2 98%   BMI 33.35 kg/m     Repeat blood pressure by me was 122/70  Wt Readings from Last 3 Encounters:  04/06/21 252 lb 12.8 oz (114.7 kg)  02/06/21 257 lb 3.2 oz (116.7 kg)  10/14/20 254 lb (115.2 kg)    General: Alert, oriented, no distress.  Skin: normal turgor, no rashes, warm and dry HEENT: Normocephalic, atraumatic. Pupils equal round and reactive to light; sclera anicteric; extraocular muscles intact;  Nose without nasal septal hypertrophy Mouth/Parynx benign; Mallinpatti scale 3 Neck: No JVD, no carotid bruits; normal carotid upstroke Lungs: clear to ausculatation and percussion; no wheezing or rales Chest wall: Resolution of prior mild previous costochondral tenderness.  Heart: PMI not displaced, RRR, s1 s2  normal, 1/6 systolic murmur, no diastolic murmur,  no rubs, gallops, thrills, or heaves Abdomen: soft, nontender; no hepatosplenomehaly, BS+; abdominal aorta nontender and not dilated by palpation. Back: no CVA tenderness Pulses 2+ Musculoskeletal: full range of motion, normal strength, no joint deformities Extremities: no clubbing cyanosis or edema, Homan's sign negative  Neurologic: grossly nonfocal; Cranial nerves grossly wnl Psychologic: Normal mood and affect   Studies/Labs Reviewed:   April 06, 2021 ECG (independently read by me): Normal sinus rhythm at 68 bpm.  Q-wave present in lead III with nondiagnostic Q-wave in aVF.  Nondiagnostic T wave abnormality in lead III and aVF.  I personally reviewed the EKG from February 02, 2021: Normal sinus rhythm at 94 bpm.  T wave abnormality in lead III and aVF with nondiagnostic Q-wave in lead III.  Recent Labs: BMP Latest Ref Rng & Units 02/10/2021 02/02/2021 04/28/2009  Glucose 65 - 99 mg/dL 132(H) 179(H) 114(H)  BUN 8 - 27 mg/dL 19 19 10   Creatinine 0.76 - 1.27 mg/dL 0.96 1.03 1.10  BUN/Creat Ratio 10 - 24 20 - -  Sodium 134 - 144 mmol/L 143 140 141  Potassium 3.5 - 5.2 mmol/L 4.8 4.0 5.1  Chloride 96 - 106 mmol/L 105 105 103  CO2 20 - 29 mmol/L 25 26 26   Calcium 8.6 - 10.2 mg/dL 9.5 9.7 9.7     Hepatic Function Latest Ref Rng & Units 02/10/2021 02/02/2021 04/28/2009  Total Protein 6.0 - 8.5 g/dL 7.2 7.5 7.5  Albumin 3.8 - 4.8 g/dL 4.7 4.6 4.8  AST 0 - 40 IU/L 21 22 16   ALT 0 - 44 IU/L 30 31 20   Alk Phosphatase 44 - 121 IU/L 78 67 72  Total Bilirubin 0.0 - 1.2 mg/dL 0.7 0.9 0.8    CBC Latest Ref Rng & Units 02/10/2021 02/02/2021 04/28/2009  WBC 3.4 - 10.8 x10E3/uL 5.1 7.0 6.1  Hemoglobin 13.0 - 17.7 g/dL 15.2 16.1 14.6  Hematocrit 37.5 - 51.0 % 42.7 45.6 41.2  Platelets 150 - 450 x10E3/uL 222 230 301   Lab Results  Component Value Date   MCV 93 02/10/2021   MCV 95.6 02/02/2021   MCV 93.3 04/28/2009   Lab Results  Component  Value Date   TSH 0.614 02/10/2021   No results found for: HGBA1C   BNP    Component Value Date/Time   BNP 8.5 02/02/2021 0617    ProBNP No results found for: PROBNP   Lipid Panel     Component Value Date/Time   CHOL 171 02/10/2021 0952   TRIG 148 02/10/2021 0952   HDL 36 (L) 02/10/2021 0952   CHOLHDL 4.8 02/10/2021 0952   LDLCALC 109 (H) 02/10/2021 0952   LABVLDL 26 02/10/2021 0952     RADIOLOGY: No results found.   Additional studies/ records that were reviewed today include:   I reviewed the records of Dr. Christy Gentles from his February 02, 2021 [facility] evaluation.   ASSESSMENT:    1. Essential hypertension   2. Chest pain, unspecified type   3. Agatston coronary artery calcium score less than 100   4. Mild coronary artery calcification   5. Mild dilation of ascending aorta (HCC)   6. Class 1 obesity due to excess calories without serious comorbidity with body mass index (BMI) of 34.0 to 34.9 in adult   7. Gastroesophageal reflux disease without esophagitis     PLAN:  Mr. Phillip [name] is a 65 year old gentleman who has a history of hypertension and has been treated with amlodipine 5 mg  in addition to valsartan 160 mg daily.  He also has history of GERD.  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  He denies any exertional chest pain.   He presented to [facility] after developing some pressure in his upper epigastric abdomen and vague pressure in his chest.  He had some tenderness alongside his breastbone bilaterally.  He became somewhat diaphoretic, began to hyperventilate, and then became nauseated and vomited with ultimate resolution of his symptomatology.  His ECG showed T wave abnormality in leads III and aVF.  His troponins were negative.  At my initial evaluation he had mild tenderness along the costochondral region and also had abdominal bloating which improved with Gas-X pills.  I  reviewed his subsequent laboratory and imaging studies with him in detail.  His echo Doppler study reveals hyperdynamic LV function with mild grade 1 diastolic dysfunction.  There was mildly increased mention to his ascending aorta at 42 mm.  I reviewed his coronary CTA.  This revealed a calcium score of 75 with evidence for mild scattered calcific proximal LAD plaque in the 0 to 24%.  With his demonstration of mild nonobstructive CAD, however I have recommended initiation of lipid-lowering therapy and attempt to induce plaque stability and potential plaque regression.  Most recent laboratory revealed a total cholesterol 171, triglycerides 148, HDL 36 and LDL cholesterol 109.  I have recommended a target LDL of less than 70.  I will start him on rosuvastatin 10 mg daily with plans for follow-up laboratory and chemistry in 3 months.  His blood pressure today is well controlled on his regimen.   His GERD is controlled with omeprazole.  I will see him in 4 months for follow-up evaluation.   Medication Adjustments/Labs and Tests Ordered: Current medicines are reviewed at length with the patient today.  Concerns regarding medicines are outlined above.  Medication changes, Labs and Tests ordered today are listed in the Patient Instructions below. Patient Instructions  Medication Instructions:   Rosuvastatin  10 mg  one tablet daily  *If you need a refill on your cardiac medications before your next appointment, please call your pharmacy*   Lab Work:  CMP LIPID-fasting  IN [facility] 2022 If you have labs (blood work) drawn today and your tests are completely normal, you will receive your results only by: [place] (if you have MyChart) OR A paper copy in the mail If you have any lab test that is abnormal or we need to change your treatment, we will call you to review the results.   Testing/Procedures: Not needed   Follow-Up: At [facility], you and your health needs are our priority.  As part  of our continuing mission to provide you with exceptional heart care, we have created designated Provider Care Teams.  These Care Teams include your primary Cardiologist (physician) and Advanced Practice Providers (APPs -  Physician Assistants and Nurse Practitioners) who all work together to provide you with the care you need, when you need it.  We recommend signing up for the patient portal called "MyChart".  Sign up information is provided on this After Visit Summary.  MyChart is used to connect with patients for Virtual Visits (Telemedicine).  Patients are able to view lab/test results, encounter notes, upcoming appointments, etc.  Non-urgent messages can be sent to your provider as well.   To learn more about what you can do with MyChart, go to [email or web address].    Your  next appointment:   4 month(s) Jan 2023  The format for your next appointment:   In Person  Provider:   Shelva Majestic, MD     Signed, Shelva Majestic, MD  04/07/2021 6:27 PM    [facility] [address], [place], [place], [place]  [address] Phone: [phone]

## 2021-04-06 NOTE — Patient Instructions (Signed)
Medication Instructions:   Rosuvastatin  10 mg  one tablet daily  *If you need a refill on your cardiac medications before your next appointment, please call your pharmacy*   Lab Work:  CMP LIPID-fasting  IN [facility] 2022 If you have labs (blood work) drawn today and your tests are completely normal, you will receive your results only by: [place] (if you have MyChart) OR A paper copy in the mail If you have any lab test that is abnormal or we need to change your treatment, we will call you to review the results.   Testing/Procedures: Not needed   Follow-Up: At [facility], you and your health needs are our priority.  As part of our continuing mission to provide you with exceptional heart care, we have created designated Provider Care Teams.  These Care Teams include your primary Cardiologist (physician) and Advanced Practice Providers (APPs -  Physician Assistants and Nurse Practitioners) who all work together to provide you with the care you need, when you need it.  We recommend signing up for the patient portal called "MyChart".  Sign up information is provided on this After Visit Summary.  MyChart is used to connect with patients for Virtual Visits (Telemedicine).  Patients are able to view lab/test results, encounter notes, upcoming appointments, etc.  Non-urgent messages can be sent to your provider as well.   To learn more about what you can do with MyChart, go to [email or web address].    Your next appointment:   4 month(s) Jan 2023  The format for your next appointment:   In Person  Provider:   Shelva Majestic, MD

## 2021-04-07 ENCOUNTER — Encounter: Payer: Self-pay | Admitting: Cardiovascular Disease

## 2021-07-18 DIAGNOSIS — R7301 Impaired fasting glucose: Secondary | ICD-10-CM | POA: Diagnosis not present

## 2021-07-18 DIAGNOSIS — I10 Essential (primary) hypertension: Secondary | ICD-10-CM | POA: Diagnosis not present

## 2021-07-18 DIAGNOSIS — Z0000 Encounter for general adult medical examination without abnormal findings: Secondary | ICD-10-CM | POA: Diagnosis not present

## 2021-07-18 DIAGNOSIS — K5901 Slow transit constipation: Secondary | ICD-10-CM | POA: Diagnosis not present

## 2021-07-18 DIAGNOSIS — E782 Mixed hyperlipidemia: Secondary | ICD-10-CM | POA: Diagnosis not present

## 2021-08-01 DIAGNOSIS — J3489 Other specified disorders of nose and nasal sinuses: Secondary | ICD-10-CM | POA: Diagnosis not present

## 2021-08-01 DIAGNOSIS — Z03818 Encounter for observation for suspected exposure to other biological agents ruled out: Secondary | ICD-10-CM | POA: Diagnosis not present

## 2021-08-01 DIAGNOSIS — J029 Acute pharyngitis, unspecified: Secondary | ICD-10-CM | POA: Diagnosis not present

## 2021-08-01 DIAGNOSIS — R051 Acute cough: Secondary | ICD-10-CM | POA: Diagnosis not present

## 2021-08-14 ENCOUNTER — Ambulatory Visit: Payer: BC Managed Care – PPO | Admitting: Neurology

## 2021-08-14 NOTE — Progress Notes (Deleted)
Follow-up Visit   Date: 08/14/21   DARL KUSS MRN: [id number] DOB: [birth year]   Interim History: Phillip [name] is a 66 y.o. ***-handed Caucasian/*** male with colon cancer (2010) and hypertension returning to the clinic for with new complains of feet numbness.  The patient was accompanied to the clinic by *** who also provides collateral information.    History of present illness: He was seen in April 2022 forl left ptosis. AChR negative.  He was referred for single fiber EMG ***  UPDATE 08/14/2021:  ***  Medications:  Current Outpatient Medications on File Prior to Visit  Medication Sig Dispense Refill   acetaminophen (TYLENOL) 500 MG tablet Take 500 mg by mouth in the morning. Patient takes 2 tablets     Ascorbic Acid (VITAMIN C PO) Take 1,000 mg by mouth every morning.     B Complex-Biotin-FA (SUPER B-50 COMPLEX PO) Take 1,000 mcg by mouth daily.     beta carotene w/minerals (OCUVITE) tablet Take 1 tablet by mouth every morning.     Cholecalciferol (VITAMIN D PO) Take 1 tablet by mouth every morning.     cyclobenzaprine (FLEXERIL) 10 MG tablet Take 10 mg by mouth as needed for muscle spasms.     fluticasone (FLONASE) 50 MCG/ACT nasal spray Place 1 spray into both nostrils daily as needed for allergies or rhinitis.     ibuprofen (ADVIL,MOTRIN) 200 MG tablet Take 400 mg by mouth every 6 (six) hours as needed for headache or moderate pain.     Multiple Vitamin (MULTIVITAMIN WITH MINERALS) TABS tablet Take 1 tablet by mouth every morning.     omeprazole (PRILOSEC) 20 MG capsule Take 20 mg by mouth daily.     OVER THE COUNTER MEDICATION Take 1 tablet by mouth daily. Nervive     rosuvastatin (CRESTOR) 10 MG tablet Take 1 tablet (10 mg total) by mouth daily. 90 tablet 3   valsartan (DIOVAN) 160 MG tablet Take 160 mg by mouth daily.     vitamin E 180 MG (400 UNITS) capsule Take 400 Units by mouth daily.     Zinc 50 MG TABS Take 50 mg by mouth daily.     No current  facility-administered medications on file prior to visit.    Allergies:  Allergies  Allergen Reactions   Penicillins Anaphylaxis and Hives    + any in the cillin family.    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     Vital Signs:  There were no vitals taken for this visit.   General Medical Exam:   General:  Well appearing, comfortable  Eyes/ENT: see cranial nerve examination.   Neck:  No carotid bruits. Respiratory:  Clear to auscultation, good air entry bilaterally.   Cardiac:  Regular rate and rhythm, no murmur.   Ext:  No edema ***  Neurological Exam: MENTAL STATUS including orientation to time, place, person, recent and remote memory, attention span and concentration, language, and fund of knowledge is ***normal.  Speech is not dysarthric.  CRANIAL NERVES:  No visual field defects.  Pupils equal round and reactive to light.  Normal conjugate, extra-ocular eye movements in all directions of gaze.  No ptosis ***.  Face is symmetric. Palate elevates symmetrically.  Tongue is midline.  MOTOR:  Motor strength is 5/5 in all extremities, ***.  No atrophy, fasciculations or abnormal movements.  No pronator drift.  Tone is normal.    MSRs:  Reflexes  are 2+/4 throughout ***.  SENSORY:  Intact to vibration throughout ***.  COORDINATION/GAIT:  Normal finger-to- nose-finger.  Intact rapid alternating movements bilaterally.  Gait narrow based and stable.   Data:***  IMPRESSION/PLAN: ***  PLAN/RECOMMENDATIONS:  ***  Return to clinic in ***.   Total time spent:  ***  Thank you for allowing me to participate in patient's care.  If I can answer any additional questions, I would be pleased to do so.    Sincerely,    Alexandru Moorer K. Posey Pronto, DO

## 2021-08-15 ENCOUNTER — Other Ambulatory Visit: Payer: Self-pay

## 2021-08-15 ENCOUNTER — Ambulatory Visit: Payer: BC Managed Care – PPO | Admitting: Cardiovascular Disease

## 2021-08-15 ENCOUNTER — Encounter: Payer: Self-pay | Admitting: Cardiovascular Disease

## 2021-08-15 VITALS — BP 116/67 | HR 67 | Ht 73.0 in | Wt 245.8 lb

## 2021-08-15 DIAGNOSIS — E785 Hyperlipidemia, unspecified: Secondary | ICD-10-CM | POA: Diagnosis not present

## 2021-08-15 DIAGNOSIS — I10 Essential (primary) hypertension: Secondary | ICD-10-CM

## 2021-08-15 DIAGNOSIS — I2510 Atherosclerotic heart disease of native coronary artery without angina pectoris: Secondary | ICD-10-CM

## 2021-08-15 DIAGNOSIS — Z6834 Body mass index (BMI) 34.0-34.9, adult: Secondary | ICD-10-CM

## 2021-08-15 DIAGNOSIS — R931 Abnormal findings on diagnostic imaging of heart and coronary circulation: Secondary | ICD-10-CM | POA: Diagnosis not present

## 2021-08-15 DIAGNOSIS — Z79899 Other long term (current) drug therapy: Secondary | ICD-10-CM

## 2021-08-15 DIAGNOSIS — I77810 Thoracic aortic ectasia: Secondary | ICD-10-CM

## 2021-08-15 DIAGNOSIS — E6609 Other obesity due to excess calories: Secondary | ICD-10-CM

## 2021-08-15 NOTE — Patient Instructions (Signed)
Medication Instructions:  The current medical regimen is effective;  continue present plan and medications as directed. Please refer to the Current Medication list given to you today.   *If you need a refill on your cardiac medications before your next appointment, please call your pharmacy*  Lab Work:    FASTING LIPID AND CMET  Follow-Up: Your next appointment:  12 month(s) In Person with Shelva Majestic, MD   Please call our office 2 months in advance to schedule this appointment.1  At [facility], you and your health needs are our priority.  As part of our continuing mission to provide you with exceptional heart care, we have created designated Provider Care Teams.  These Care Teams include your primary Cardiologist (physician) and Advanced Practice Providers (APPs -  Physician Assistants and Nurse Practitioners) who all work together to provide you with the care you need, when you need it.  We recommend signing up for the patient portal called "MyChart".  Sign up information is provided on this After Visit Summary.  MyChart is used to connect with patients for Virtual Visits (Telemedicine).  Patients are able to view lab/test results, encounter notes, upcoming appointments, etc.  Non-urgent messages can be sent to your provider as well.   To learn more about what you can do with MyChart, go to [email or web address].

## 2021-08-15 NOTE — Progress Notes (Signed)
Cardiology Office Note    Date:  08/15/2021   ID:  Jahsir, Rama [birth year], MRN [id number]  PCP:  Lavone Orn, MD  Cardiologist:  Shelva Majestic, MD   4  month F/U initially referred through the courtesy of Dr. Ripley Fraise following an emergency room evaluation February 03, 2021.  History of Present Illness:  DELMORE SEAR is a 66 y.o. male who is followed by Dr. Lavone Orn for primary care.  He has a several year history of hypertension as well as GERD.  I saw him for initial evaluation on February 06, 2021.  He presents for follow-up evaluation.  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  He remains active at work and previously denied any awareness of cardiac issues.  There is a family history for premature heart disease with his father dying at age 53 following a cardiac arrest.  In addition, a brother has undergone CABG revascularization surgery.  On the evening of July 21, he began to notice some pressure in his stomach as well as in his lower chest in addition to alongside his sternum.  He also began to notice some shortness of breath, became clammy, and started to hyperventilate.  He became nauseated vomited x3 with improvement in symptoms.  He ultimately presented to the emergency room and was given baby aspirin by EMS.  He apparently waited 17 hours in the emergency room before being brought back.  He ultimately was evaluated by Dr. Doylene Canard who felt the patient was well-appearing.  His troponins were unremarkable.  His chest x-ray was negative was negative.  He reviewed the EMS run sheet and prehospital ECG which were normal.  His pulse ox was 92 to 93%.  He was not given any sublingual nitroglycerin.  At the time of his evaluation he was pain-free he did not feel to have an ischemic etiology to his chest pain but he was discharged with recommendation to have early cardiology evaluation follow-up..  I saw him for initial evaluation he felt well and denied any recurrent  symptomatology.    Retrospectively, at times he has noted some epigastric and abdominal bloating.  Recently has noticed that these have improved with Gas-X pills.  He denied any exertionally precipitated chest tightness.  He was unaware of any significant episodes of tachycardia.  He has been on amlodipine 5 mg and valsartan 160 mg for hypertension and omeprazole 20 mg for GERD.  He takes Celebrex as needed for musculoskeletal discomfort.    During my initial evaluation, I did not feel that his symptoms were entirely ischemic in origin.  He had mild tenderness along the costochondral region and also admitted to abdominal bloating which improved with Gas-X pills.  I recommended he undergo a 2D echo Doppler study, scheduled him for fasting laboratory and also recommended cardiac CT imaging.  A 2D echo Doppler study in March 03, 2021 which revealed hyperdynamic LV function with EF at 65 to 70%.  There was grade 1 diastolic dysfunction.  There was mild dilation of his ascending aorta at 42 mm.  Laboratory revealed a total cholesterol 171, triglycerides 148, HDL 36, and LDL 109.  He had normal chemistry with exception of elevated glucose at 132.  CBC was stable.  CT coronary angiography revealed a calcium score of 75, placing him at the 55th percentile for age and sex matched controls.  He had mild scattered calcific proximal LAD plaque of 0 to 24%.   I last saw  him on April 06, 2021.  At that time he felt well and denied any recurrent chest pain or prior symptomatology.  During that evaluation I reviewed his coronary CTA.  With his strong family history for premature heart disease and his mild plaque with calcium score at 75 I recommended initiation of rosuvastatin 10 mg.  He has tolerated this well.  Over the past several months, he denies any chest pain or shortness of breath.  He remains active.  He continues to be on amlodipine 5 mg and valsartan 160 mg for hypertension.  He is tolerating rosuvastatin  10 mg without side effects.  He is on omeprazole for GERD.  He has not had follow-up laboratory.  He presents for evaluation.  Past Medical History:  Diagnosis Date   Arthritis    fingers left little finger,4th joint after.   Blood pressure elevated    controls with diet and aspirin   Cancer ([place])    colon cancer dx.-surgery with 2 inches of rectum removed.only 5 yrs ago   Constipation    uses fiber to help regulate   Fracture, radius    left arm, age 14    Hypertension    PONV (postoperative nausea and vomiting)    Torn meniscus 08/02/2020   [place] on sleet/ice    Past Surgical History:  Procedure Laterality Date   ANTERIOR CERVICAL DECOMP/DISCECTOMY FUSION Right    APPENDECTOMY     BACK SURGERY     rupture disc.   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 Patient takes 2 tablets     Ascorbic Acid (VITAMIN C PO) Take 1,000 mg by mouth every morning.     B Complex-Biotin-FA (SUPER B-50 COMPLEX PO) Take 1,000 mcg by mouth daily.     beta carotene w/minerals (OCUVITE) tablet Take 1 tablet by mouth every morning.     Cholecalciferol (VITAMIN D PO) Take 1 tablet by mouth every morning.     cyclobenzaprine (FLEXERIL) 10 MG tablet Take 10 mg by mouth as needed for muscle spasms.     fluticasone (FLONASE) 50 MCG/ACT nasal spray Place 1 spray into both nostrils daily as needed for allergies or rhinitis.     ibuprofen (ADVIL,MOTRIN) 200  MG tablet Take 400 mg by mouth every 6 (six) hours as needed for headache or moderate pain.     Multiple Vitamin (MULTIVITAMIN WITH MINERALS) TABS tablet Take 1 tablet by mouth every morning.     omeprazole (PRILOSEC) 20 MG capsule Take 20 mg by mouth daily.     OVER THE COUNTER MEDICATION Take 1 tablet by mouth daily. Nervive     valsartan (DIOVAN) 160 MG tablet Take 160 mg by mouth daily.     vitamin E 180 MG (400 UNITS) capsule Take 400 Units by mouth daily.     Zinc 50 MG TABS Take 50 mg by mouth daily.     amLODipine (  NORVASC) 5 MG tablet Take 5 mg by mouth daily.     doxycycline (VIBRA-TABS) 100 MG tablet Take 100 mg by mouth 2 (two) times daily.     ondansetron (ZOFRAN) 4 MG tablet Take 4 mg by mouth 2 (two) times daily as needed.     rosuvastatin (CRESTOR) 10 MG tablet Take 1 tablet (10 mg total) by mouth daily. 90 tablet 3   No facility-administered medications prior to visit.     Allergies:   Penicillins and Sulfa antibiotics   Social History   Socioeconomic History   Marital status: Married    Spouse name: Mitchell Iwanicki   Number of children: 2   Years of education: Not on file   Highest education level: Not on file  Occupational History   Not on file  Tobacco Use   Smoking status: Former    Types: Cigarettes, Cigars, Pipe    Quit date: 11/30/1966    Years since quitting: 84.7   Smokeless tobacco: Former    Types: Chew   Tobacco comments:    Quit after teen yrs- no tobbacco use x15 yrs, Chewing tobacco x4 yrs ago  Substance and Sexual Activity   Alcohol use: No   Drug use: No   Sexual activity: Not on file  Other Topics Concern   Not on file  Social History Narrative   Both left and right handed    Lives in a one story home with a basement   Drinks caffeine    Social Determinants of Health   Financial Resource Strain: Not on file  Food Insecurity: Not on file  Transportation Needs: Not on file  Physical Activity: Not on file  Stress: Not on file  Social  Connections: Not on file    Socially he is married for 22 years.  He has 2 children.  He refers to his wife as "Geologist, engineering. "  There is no tobacco or alcohol history.  He does not exercise regularly but works long hours.  He works as a Administrator for [employer].  He completed 12 grade of education.   Family History:  The patient's family history includes Heart attack in his mother; Hypotension in his father.   ROS General: Negative; No fevers, chills, or night sweats;  HEENT: Negative; No changes in vision or hearing, sinus congestion, difficulty swallowing Pulmonary: Negative; No cough, wheezing, shortness of breath, hemoptysis Cardiovascular: Negative; No chest pain, presyncope, syncope, palpitations GI: Negative; No nausea, vomiting, diarrhea, or abdominal pain GU: Negative; No dysuria, hematuria, or difficulty voiding Musculoskeletal: History of right knee torn meniscus; cervical C5-C6-C7 neck disease Hematologic/Oncology: History of colon CA, status post partial bowel resection Endocrine: Negative; no heat/cold intolerance; no diabetes Neuro: Negative; no changes in balance, headaches Skin: Negative; No rashes or skin lesions Psychiatric: Negative; No behavioral problems, depression Sleep: Negative; No snoring, daytime sleepiness, hypersomnolence, bruxism, restless legs, hypnogognic hallucinations, no cataplexy Other comprehensive 14 point system review is negative.   PHYSICAL EXAM:   VS:  BP 116/67    Pulse 67    Ht 6' 1"  (1.854 m)    Wt 245 lb 12.8 oz (111.5 kg)    SpO2 97%    BMI 32.43 kg/m     Repeat blood pressure by me was 114/66  Wt Readings from Last 3 Encounters:  08/15/21 245 lb 12.8 oz (111.5 kg)  04/06/21 252 lb 12.8 oz (114.7 kg)  02/06/21 257 lb 3.2 oz (116.7 kg)    General:  Alert, oriented, no distress.  Skin: normal turgor, no rashes, warm and dry HEENT: Normocephalic, atraumatic. Pupils equal round and reactive to light; sclera  anicteric; extraocular muscles intact; Nose without nasal septal hypertrophy Mouth/Parynx benign; Mallinpatti scale 3 Neck: No JVD, no carotid bruits; normal carotid upstroke Lungs: clear to ausculatation and percussion; no wheezing or rales Chest wall: No residual chest wall tenderness Heart: PMI not displaced, RRR, s1 s2 normal, 1/6 systolic murmur, no diastolic murmur, no rubs, gallops, thrills, or heaves Abdomen: soft, nontender; no hepatosplenomehaly, BS+; abdominal aorta nontender and not dilated by palpation. Back: no CVA tenderness Pulses 2+ Musculoskeletal: full range of motion, normal strength, no joint deformities Extremities: no clubbing cyanosis or edema, Homan's sign negative  Neurologic: grossly nonfocal; Cranial nerves grossly wnl Psychologic: Normal mood and affect   Studies/Labs Reviewed:   August 15, 2021 ECG (independently read by me): NSR at 67; normal intervals,no ectopy  April 06, 2021 ECG (independently read by me): Normal sinus rhythm at 68 bpm.  Q-wave present in lead III with nondiagnostic Q-wave in aVF.  Nondiagnostic T wave abnormality in lead III and aVF.  I personally reviewed the EKG from February 02, 2021: Normal sinus rhythm at 94 bpm.  T wave abnormality in lead III and aVF with nondiagnostic Q-wave in lead III.  Recent Labs: BMP Latest Ref Rng & Units 02/10/2021 02/02/2021 04/28/2009  Glucose 65 - 99 mg/dL 132(H) 179(H) 114(H)  BUN 8 - 27 mg/dL 19 19 10   Creatinine 0.76 - 1.27 mg/dL 0.96 1.03 1.10  BUN/Creat Ratio 10 - 24 20 - -  Sodium 134 - 144 mmol/L 143 140 141  Potassium 3.5 - 5.2 mmol/L 4.8 4.0 5.1  Chloride 96 - 106 mmol/L 105 105 103  CO2 20 - 29 mmol/L 25 26 26   Calcium 8.6 - 10.2 mg/dL 9.5 9.7 9.7     Hepatic Function Latest Ref Rng & Units 02/10/2021 02/02/2021 04/28/2009  Total Protein 6.0 - 8.5 g/dL 7.2 7.5 7.5  Albumin 3.8 - 4.8 g/dL 4.7 4.6 4.8  AST 0 - 40 IU/L 21 22 16   ALT 0 - 44 IU/L 30 31 20   Alk Phosphatase 44 - 121 IU/L  78 67 72  Total Bilirubin 0.0 - 1.2 mg/dL 0.7 0.9 0.8    CBC Latest Ref Rng & Units 02/10/2021 02/02/2021 04/28/2009  WBC 3.4 - 10.8 x10E3/uL 5.1 7.0 6.1  Hemoglobin 13.0 - 17.7 g/dL 15.2 16.1 14.6  Hematocrit 37.5 - 51.0 % 42.7 45.6 41.2  Platelets 150 - 450 x10E3/uL 222 230 301   Lab Results  Component Value Date   MCV 93 02/10/2021   MCV 95.6 02/02/2021   MCV 93.3 04/28/2009   Lab Results  Component Value Date   TSH 0.614 02/10/2021   No results found for: HGBA1C   BNP    Component Value Date/Time   BNP 8.5 02/02/2021 0617    ProBNP No results found for: PROBNP   Lipid Panel     Component Value Date/Time   CHOL 171 02/10/2021 0952   TRIG 148 02/10/2021 0952   HDL 36 (L) 02/10/2021 0952   CHOLHDL 4.8 02/10/2021 0952   LDLCALC 109 (H) 02/10/2021 0952   LABVLDL 26 02/10/2021 0952     RADIOLOGY: No results found.   Additional studies/ records that were reviewed today include:   I reviewed the records of Dr. Christy Gentles from his February 02, 2021 [facility] ER evaluation.   ASSESSMENT:    1. Essential hypertension  2. Agatston coronary artery calcium score less than 100   3. Hyperlipidemia with target LDL less than 70   4. Mild coronary artery calcification   5. Medication management   6. Mild dilation of ascending aorta (HCC)   7. Class 1 obesity due to excess calories without serious comorbidity with body mass index (BMI) of 34.0 to 34.9 in adult     PLAN:  Mr. Amador "Mo" Brentlinger is a 66 year old gentleman who has a history of hypertension and has been treated with amlodipine 5 mg in addition to valsartan 160 mg daily.  He also has history of GERD.  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  He denies any exertional chest pain.   He presented to [facility] after developing some pressure in his upper epigastric abdomen and vague pressure in his chest.  He had some tenderness alongside his  breastbone bilaterally.  He became somewhat diaphoretic, began to hyperventilate, and then became nauseated and vomited with ultimate resolution of his symptomatology.  His ECG showed T wave abnormality in leads III and aVF.  His troponins were negative.  At my initial evaluation he had mild tenderness along the costochondral region and also had abdominal bloating which improved with Gas-X pills.  I reviewed his subsequent laboratory and imaging studies with him in detail.  His echo Doppler study reveals hyperdynamic LV function with mild grade 1 diastolic dysfunction.  There was mildly increased mention to his ascending aorta at 42 mm.  With his strong family history for premature CAD he underwent coronary CTA revealed a calcium score of 75 with evidence for mild scattered calcific proximal LAD plaque in the 0 to 24%.  With his demonstration of mild nonobstructive CAD, however I have recommended initiation of lipid-lowering therapy and attempt to induce plaque stability and potential plaque regression.  Most recent laboratory revealed a total cholesterol 171, triglycerides 148, HDL 36 and LDL cholesterol 109.  I have recommended a target LDL of less than 70.  He was started on rosuvastatin 10 mg.  He has tolerated this well and denies any myalgias.  He has not yet had follow-up laboratory I am recommending a follow-up comprehensive metabolic panel and fasting lipid studies.  His blood pressure today continues to be stable with amlodipine 5 mg and valsartan 160 mg with excellent blood pressure by me at 114/66.  His GERD is controlled with omeprazole.  We discussed the Mediterranean diet for heart health.  BMI is mildly increased consistent with mild obesity.  Weight loss and continued exercise is recommended.  He will be seeing Dr. Laurann Montana later this year for his primary care evaluation.  I will contact him regarding his laboratory when available and adjustments will be made to his medical regimen if necessary.   Otherwise if he remains stable I will see him in 1 year for reevaluation or sooner as needed.    Medication Adjustments/Labs and Tests Ordered: Current medicines are reviewed at length with the patient today.  Concerns regarding medicines are outlined above.  Medication changes, Labs and Tests ordered today are listed in the Patient Instructions below. Patient Instructions  Medication Instructions:  The current medical regimen is effective;  continue present plan and medications as directed. Please refer to the Current Medication list given to you today.   *If you need a refill on your cardiac medications before your next appointment, please call your pharmacy*  Lab Work:    FASTING  LIPID AND CMET  Follow-Up: Your next appointment:  12 month(s) In Person with Shelva Majestic, MD   Please call our office 2 months in advance to schedule this appointment.1  At [facility], you and your health needs are our priority.  As part of our continuing mission to provide you with exceptional heart care, we have created designated Provider Care Teams.  These Care Teams include your primary Cardiologist (physician) and Advanced Practice Providers (APPs -  Physician Assistants and Nurse Practitioners) who all work together to provide you with the care you need, when you need it.  We recommend signing up for the patient portal called "MyChart".  Sign up information is provided on this After Visit Summary.  MyChart is used to connect with patients for Virtual Visits (Telemedicine).  Patients are able to view lab/test results, encounter notes, upcoming appointments, etc.  Non-urgent messages can be sent to your provider as well.   To learn more about what you can do with MyChart, go to [email or web address].       Signed, Shelva Majestic, MD  08/15/2021 5:47 PM    [facility] [address] [place], [place], [place]  [address] Phone: [phone]

## 2021-08-16 DIAGNOSIS — I2510 Atherosclerotic heart disease of native coronary artery without angina pectoris: Secondary | ICD-10-CM | POA: Diagnosis not present

## 2021-08-16 DIAGNOSIS — I2584 Coronary atherosclerosis due to calcified coronary lesion: Secondary | ICD-10-CM | POA: Diagnosis not present

## 2021-08-16 DIAGNOSIS — I10 Essential (primary) hypertension: Secondary | ICD-10-CM | POA: Diagnosis not present

## 2021-08-16 DIAGNOSIS — Z79899 Other long term (current) drug therapy: Secondary | ICD-10-CM | POA: Diagnosis not present

## 2021-08-16 LAB — LIPID PANEL
Chol/HDL Ratio: 2.7 ratio (ref 0.0–5.0)
Cholesterol, Total: 94 mg/dL — ABNORMAL LOW (ref 100–199)
HDL: 35 mg/dL — ABNORMAL LOW (ref >39)
LDL Chol Calc (NIH): 39 mg/dL (ref 0–99)
Triglycerides: 109 mg/dL (ref 0–149)
VLDL Cholesterol Cal: 20 mg/dL (ref 5–40)

## 2021-08-16 LAB — COMPREHENSIVE METABOLIC PANEL
ALT: 19 IU/L (ref 0–44)
AST: 21 IU/L (ref 0–40)
Albumin/Globulin Ratio: 2 (ref 1.2–2.2)
Albumin: 4.7 g/dL (ref 3.8–4.8)
Alkaline Phosphatase: 76 IU/L (ref 44–121)
BUN/Creatinine Ratio: 19 (ref 10–24)
BUN: 19 mg/dL (ref 8–27)
Bilirubin Total: 0.5 mg/dL (ref 0.0–1.2)
CO2: 28 mmol/L (ref 20–29)
Calcium: 9.9 mg/dL (ref 8.6–10.2)
Chloride: 103 mmol/L (ref 96–106)
Creatinine, Ser: 0.98 mg/dL (ref 0.76–1.27)
Globulin, Total: 2.4 g/dL (ref 1.5–4.5)
Glucose: 150 mg/dL — ABNORMAL HIGH (ref 70–99)
Potassium: 4.7 mmol/L (ref 3.5–5.2)
Sodium: 141 mmol/L (ref 134–144)
Total Protein: 7.1 g/dL (ref 6.0–8.5)
eGFR: 87 mL/min/{1.73_m2} (ref >59)

## 2021-11-06 ENCOUNTER — Ambulatory Visit: Payer: BC Managed Care – PPO | Admitting: Neurology

## 2021-11-09 DIAGNOSIS — R10816 Epigastric abdominal tenderness: Secondary | ICD-10-CM | POA: Diagnosis not present

## 2021-11-09 DIAGNOSIS — K5901 Slow transit constipation: Secondary | ICD-10-CM | POA: Diagnosis not present

## 2021-11-24 DIAGNOSIS — H43813 Vitreous degeneration, bilateral: Secondary | ICD-10-CM | POA: Diagnosis not present

## 2021-11-24 DIAGNOSIS — H532 Diplopia: Secondary | ICD-10-CM | POA: Diagnosis not present

## 2021-11-24 DIAGNOSIS — H25813 Combined forms of age-related cataract, bilateral: Secondary | ICD-10-CM | POA: Diagnosis not present

## 2021-11-24 DIAGNOSIS — H02402 Unspecified ptosis of left eyelid: Secondary | ICD-10-CM | POA: Diagnosis not present

## 2022-01-03 IMAGING — DX DG CHEST 2V
2 series · 2 of 2 positions shown · non-contrast
Comparison: 04/10/2010

CLINICAL DATA: Chest pain

EXAM:
CHEST - 2 VIEW

[w chest pa]
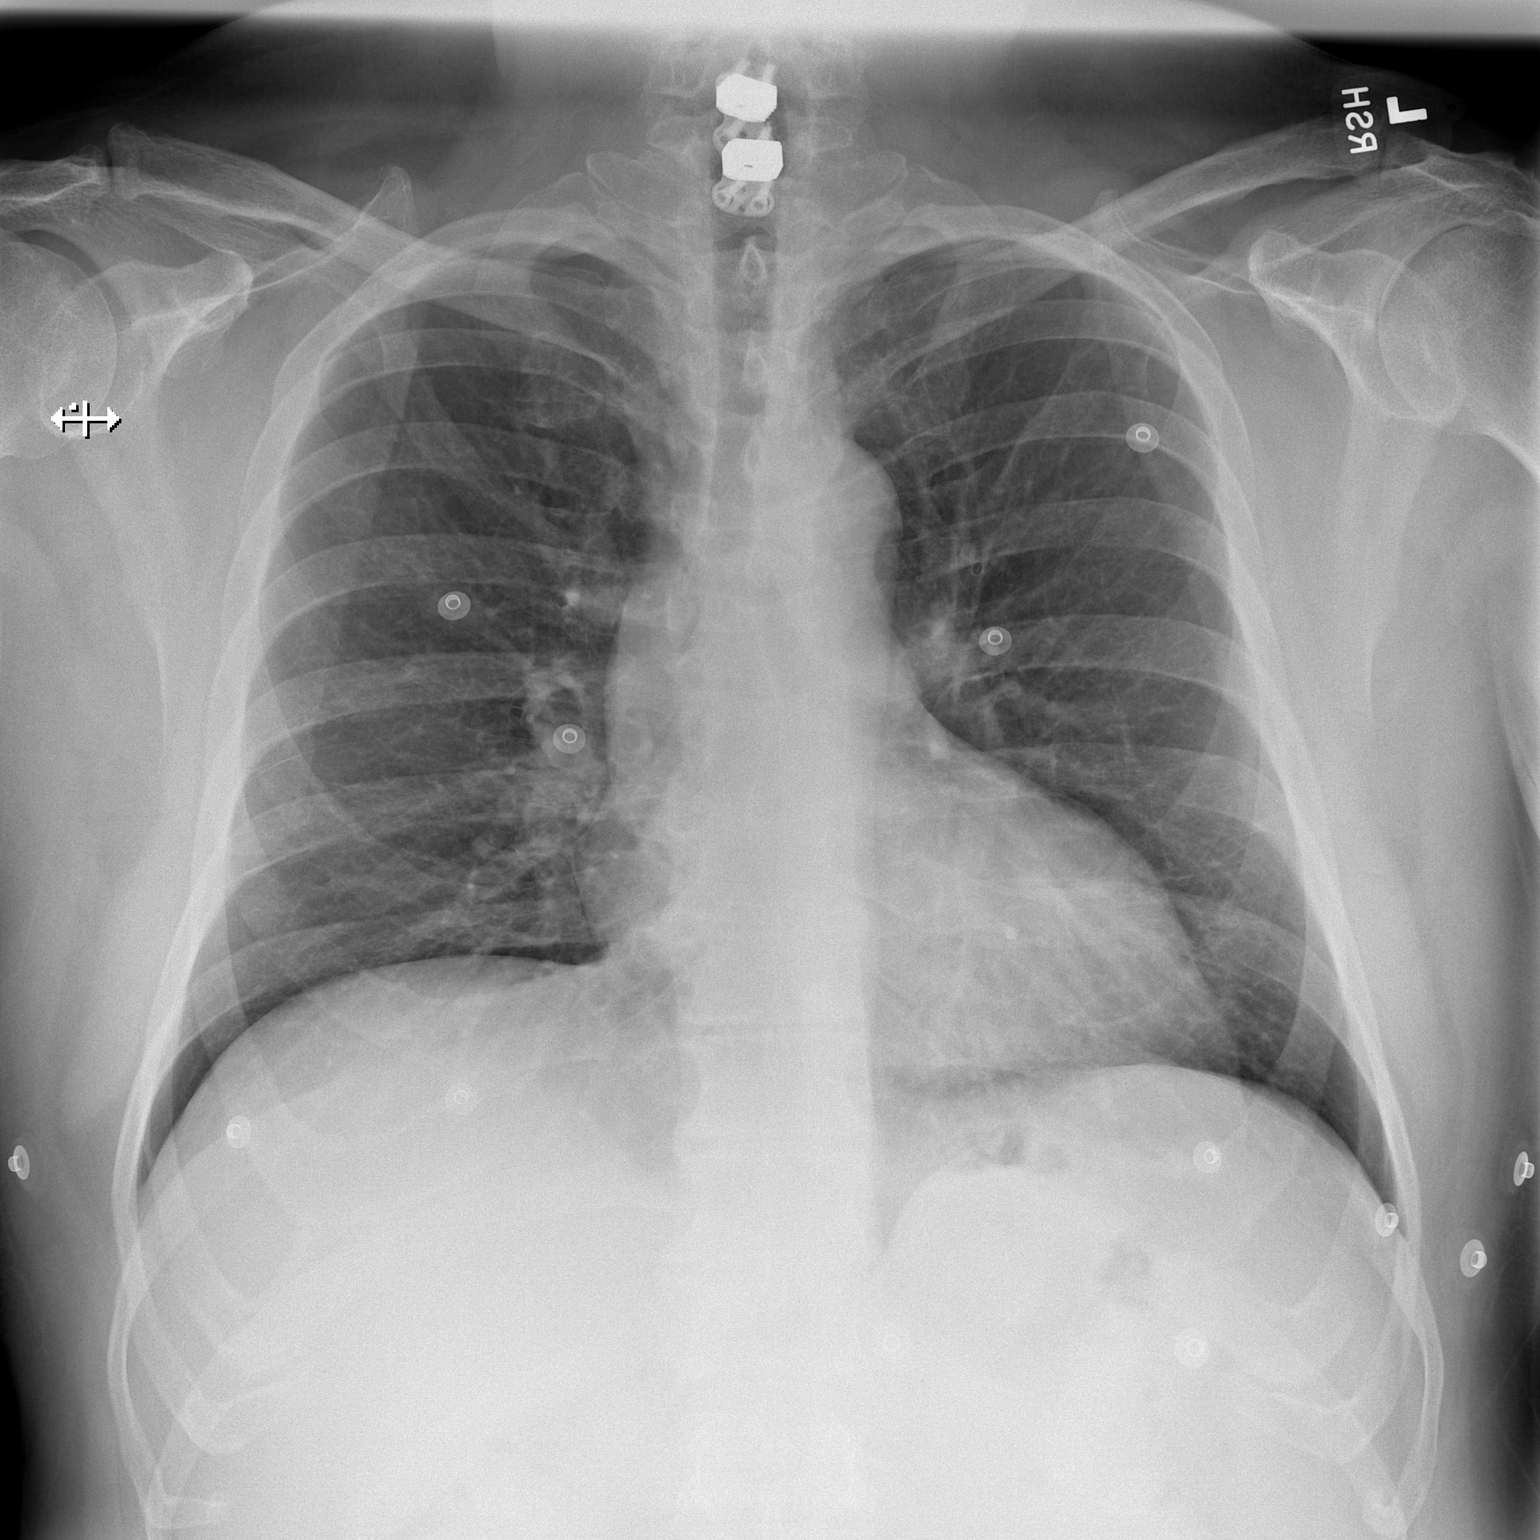

[w chest lat]
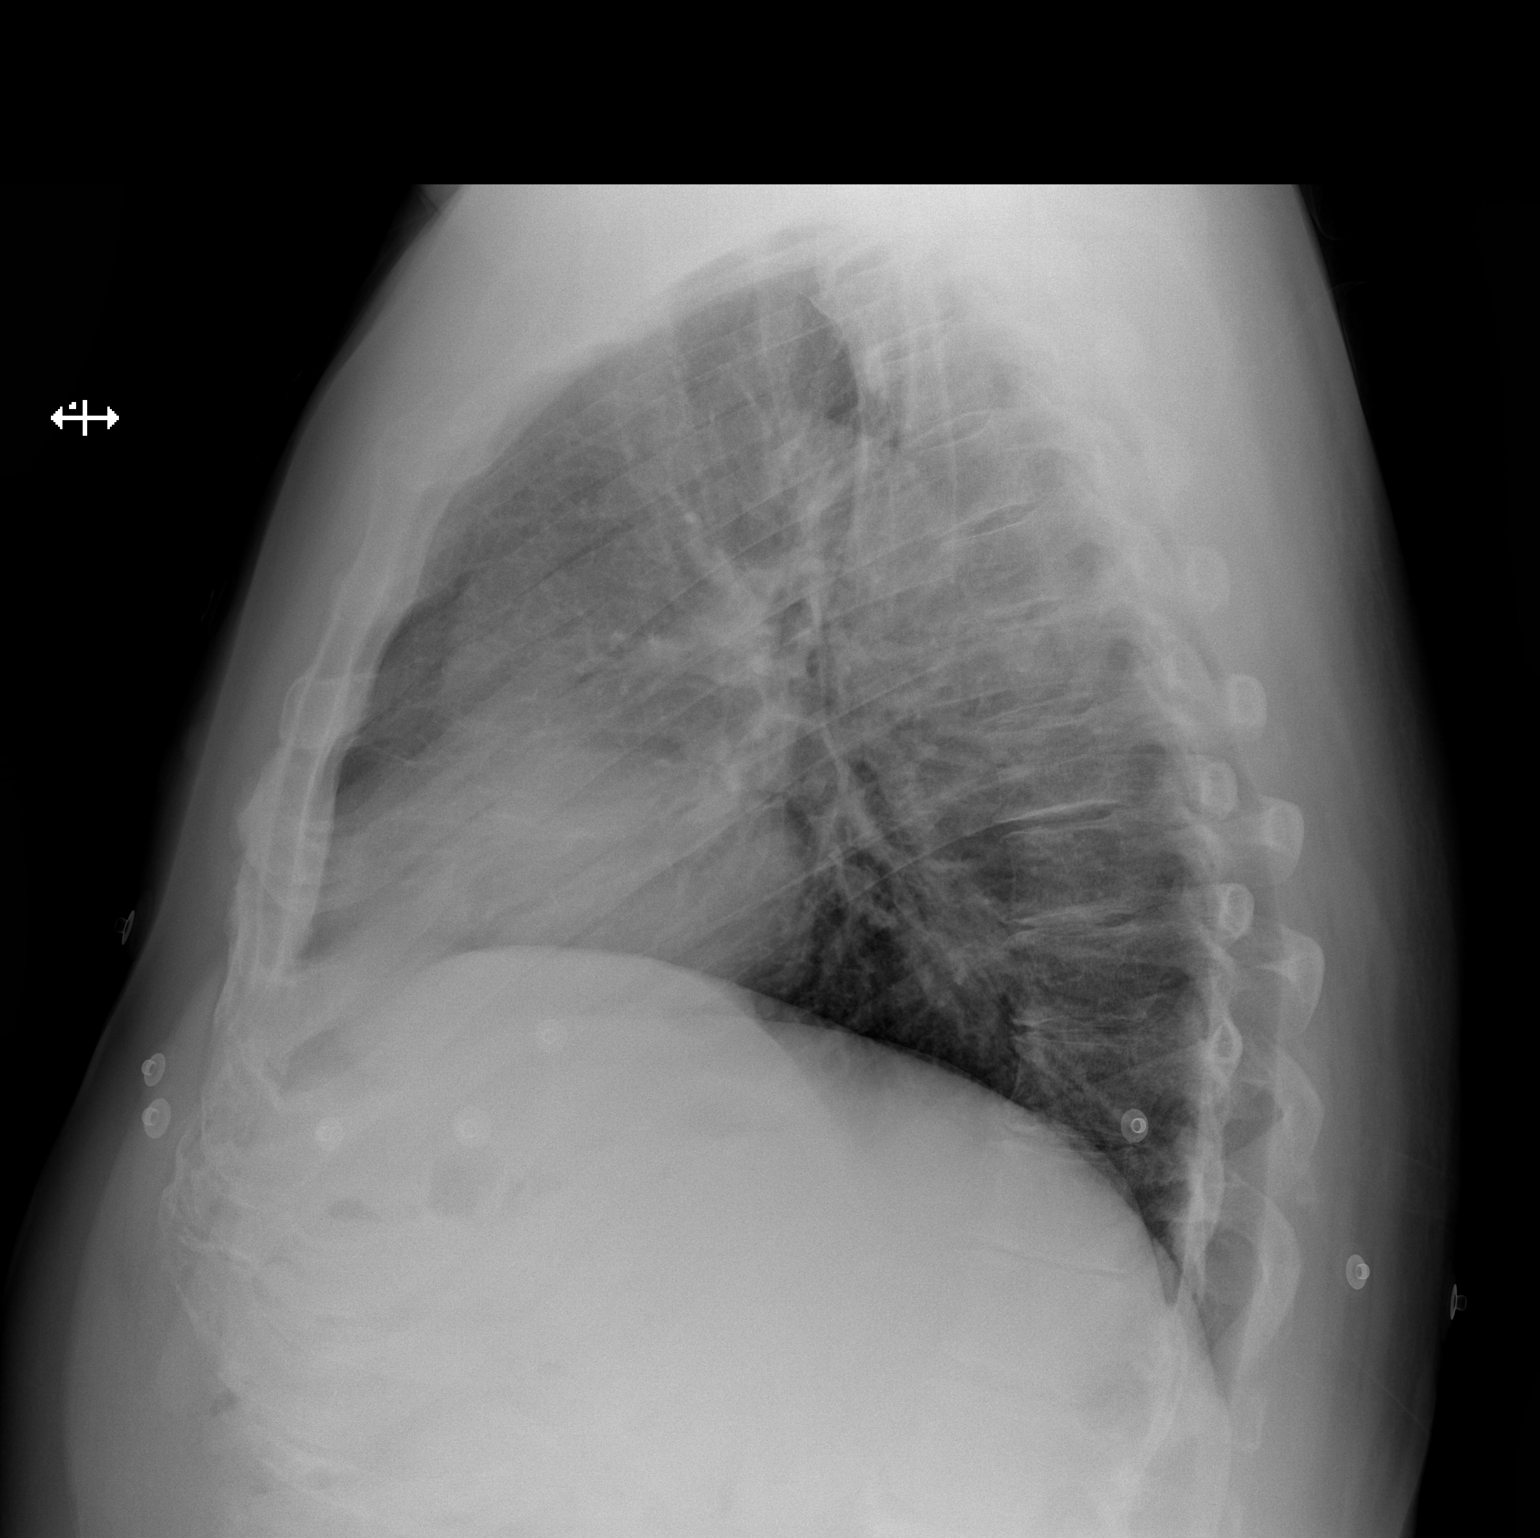

[2 of 2 positions shown; findings below may reference images not displayed]

FINDINGS: Normal heart size and mediastinal contours. No acute infiltrate or
edema. No effusion or pneumothorax. No acute osseous findings.
IMPRESSION: No active cardiopulmonary disease.

## 2022-01-05 DIAGNOSIS — H02402 Unspecified ptosis of left eyelid: Secondary | ICD-10-CM | POA: Diagnosis not present

## 2022-01-05 DIAGNOSIS — H5702 Anisocoria: Secondary | ICD-10-CM | POA: Diagnosis not present

## 2022-01-05 DIAGNOSIS — G902 Horner's syndrome: Secondary | ICD-10-CM | POA: Diagnosis not present

## 2022-01-24 DIAGNOSIS — I10 Essential (primary) hypertension: Secondary | ICD-10-CM | POA: Diagnosis not present

## 2022-01-24 DIAGNOSIS — Z85038 Personal history of other malignant neoplasm of large intestine: Secondary | ICD-10-CM | POA: Diagnosis not present

## 2022-01-24 DIAGNOSIS — R1013 Epigastric pain: Secondary | ICD-10-CM | POA: Diagnosis not present

## 2022-01-29 DIAGNOSIS — R1013 Epigastric pain: Secondary | ICD-10-CM | POA: Diagnosis not present

## 2022-01-29 DIAGNOSIS — K5900 Constipation, unspecified: Secondary | ICD-10-CM | POA: Diagnosis not present

## 2022-02-16 ENCOUNTER — Ambulatory Visit: Payer: BC Managed Care – PPO | Admitting: Neurology

## 2022-02-16 ENCOUNTER — Encounter: Payer: Self-pay | Admitting: Neurology

## 2022-02-16 VITALS — BP 145/81 | HR 86 | Resp 20 | Wt 253.0 lb

## 2022-02-16 DIAGNOSIS — R202 Paresthesia of skin: Secondary | ICD-10-CM | POA: Diagnosis not present

## 2022-02-16 DIAGNOSIS — H02402 Unspecified ptosis of left eyelid: Secondary | ICD-10-CM

## 2022-02-16 MED ORDER — GABAPENTIN 300 MG PO CAPS: 300.0000 mg | ORAL_CAPSULE | Freq: Every day | ORAL | 5 refills | Status: AC

## 2022-02-16 NOTE — Progress Notes (Signed)
Follow-up Visit   Date: 02/16/22   Phillip [name] MRN: [id number] DOB: May 24, 1956   Interim History: Phillip [name] is a 66 y.o. ambidextrous-handed Caucasian male with colon cancer (2010) and hypertension returning to the clinic for with new complaints of feet numbness.  The patient was accompanied to the clinic by wife who also provides collateral information.    Today, he complains of bilateral feet pain for the past 5 years.  Pain is located over the toes and soles, described as tingling, needle-like, and pressure-like.  Symptoms are constant.  It is worse when barefoot.  He also has imbalance and has not had any falls.  He was referred here in 2020 for EMG by his PCP, however, he was unable to tolerate NCS, so it was not completed. No history of diabetes or alcohol use.  Mother had similar symptoms.   He was previously seen here in 2022 for left ptosis.  Single finger ordered, however this was not performed. He continues to have left ptosis, however, feel that it is improved from before because it no longer interferes with his vision.    Medications:  Current Outpatient Medications on File Prior to Visit  Medication Sig Dispense Refill   acetaminophen (TYLENOL) 500 MG tablet Take 500 mg by mouth in the morning. Patient takes 2 tablets     amLODipine (NORVASC) 5 MG tablet Take 5 mg by mouth daily.     Ascorbic Acid (VITAMIN C PO) Take 1,000 mg by mouth every morning.     B Complex-Biotin-FA (SUPER B-50 COMPLEX PO) Take 1,000 mcg by mouth daily.     beta carotene w/minerals (OCUVITE) tablet Take 1 tablet by mouth every morning.     Cholecalciferol (VITAMIN D PO) Take 1 tablet by mouth every morning.     cyclobenzaprine (FLEXERIL) 10 MG tablet Take 10 mg by mouth as needed for muscle spasms.     doxycycline (VIBRA-TABS) 100 MG tablet Take 100 mg by mouth 2 (two) times daily.     fluticasone (FLONASE) 50 MCG/ACT nasal spray Place 1 spray into both nostrils daily as needed for  allergies or rhinitis.     ibuprofen (ADVIL,MOTRIN) 200 MG tablet Take 400 mg by mouth every 6 (six) hours as needed for headache or moderate pain.     Multiple Vitamin (MULTIVITAMIN WITH MINERALS) TABS tablet Take 1 tablet by mouth every morning.     omeprazole (PRILOSEC) 20 MG capsule Take 20 mg by mouth daily.     ondansetron (ZOFRAN) 4 MG tablet Take 4 mg by mouth 2 (two) times daily as needed.     OVER THE COUNTER MEDICATION Take 1 tablet by mouth daily. Nervive     rosuvastatin (CRESTOR) 10 MG tablet Take 1 tablet (10 mg total) by mouth daily. 90 tablet 3   valsartan (DIOVAN) 160 MG tablet Take 160 mg by mouth daily.     vitamin E 180 MG (400 UNITS) capsule Take 400 Units by mouth daily.     Zinc 50 MG TABS Take 50 mg by mouth daily.     No current facility-administered medications on file prior to visit.    Allergies:  Allergies  Allergen Reactions   Penicillins Anaphylaxis and Hives    + any in the cillin family.    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     Vital Signs:  BP (!) 145/81   Pulse 86   Resp 20  Wt 253 lb (114.8 kg)   SpO2 97%   BMI 33.38 kg/m   Neurological Exam: MENTAL STATUS including orientation to time, place, person, recent and remote memory, attention span and concentration, language, and fund of knowledge is normal.  Speech is not dysarthric.  CRANIAL NERVES:  No visual field defects.  Pupils equal round and reactive to light.  Normal conjugate, extra-ocular eye movements in all directions of gaze.  Mild-moderate left ptosis, with eyelid above the pupil.  Face is symmetric and muscles are 5/5. Palate elevates symmetrically.  Tongue is midline.  MOTOR:  Motor strength is 5/5 in all extremities, including distally.  No atrophy, fasciculations or abnormal movements.  No pronator drift.  Tone is normal.    MSRs:  Reflexes are 2+/4 throughout, except 1+4 bilaterally at the ankles.  SENSORY:   Vibration, pin prick and temperature reduced below the ankles.  COORDINATION/GAIT:  Gait mildly wide- based and stable, unassisted  Data: NCS/EMG of the legs 05/12/2019: This is an incomplete study.  Electrodiagnostic testing was terminated at patient's request due to pain.     IMPRESSION/PLAN: Bilateral feet pain and and paresthesias.  Exam is shows distal sensory loss and is suggestive of peripheral neuropathy.  Unfortunately, he was unable to complete NCS due to pain.  Lack of radicular features make lumbosacral disease less likely.  Discussed that we can manage his pain symptomatically and screen for causes of neuropathy. I discussed that in the vast majority of cases, despite checking for reversible causes, we are unable to find the underlying etiology and management is symptomatic.    Left ptosis, stable.  Patient did not proceed with single fiber EMG and does not want additional work-up. AChR antibody ordered at PCP's office was negative.  PLAN/RECOMMENDATIONS:  Check ESR, copper, folate, B12, TSH, SPEP with IFE Start gabapentin 339m at bedtime.  Start medication when he is not driving to monitor for side effects of sedation PT for balance declined  Return to clinic in 4 months.    Thank you for allowing me to participate in patient's care.  If I can answer any additional questions, I would be pleased to do so.    Sincerely,    Bernarda Erck K. PPosey Pronto DO

## 2022-02-16 NOTE — Patient Instructions (Addendum)
Start gabapentin '300mg'$  at bedtime  Check labs  Please call the office if you would like to start physical therapy  Return to clinic in 4 months  Your provider has requested that you have labwork completed today. Please go to [facility] ([id number]) on the second floor of this building before leaving the office today. You do not need to check in. If you are not called within 15 minutes please check with the front desk.

## 2022-03-01 ENCOUNTER — Other Ambulatory Visit: Payer: Self-pay | Admitting: Cardiovascular Disease

## 2022-07-18 ENCOUNTER — Ambulatory Visit: Payer: BC Managed Care – PPO | Admitting: Neurology

## 2022-07-20 ENCOUNTER — Ambulatory Visit: Payer: Self-pay | Admitting: Neurology

## 2022-09-06 ENCOUNTER — Other Ambulatory Visit: Payer: Self-pay | Admitting: Neurology

## 2022-09-13 ENCOUNTER — Other Ambulatory Visit: Payer: Self-pay | Admitting: Cardiovascular Disease

## 2022-09-15 ENCOUNTER — Other Ambulatory Visit: Payer: Self-pay | Admitting: Neurology

## 2022-09-19 DIAGNOSIS — Z125 Encounter for screening for malignant neoplasm of prostate: Secondary | ICD-10-CM | POA: Diagnosis not present

## 2022-09-19 DIAGNOSIS — R7301 Impaired fasting glucose: Secondary | ICD-10-CM | POA: Diagnosis not present

## 2022-09-19 DIAGNOSIS — Z85038 Personal history of other malignant neoplasm of large intestine: Secondary | ICD-10-CM | POA: Diagnosis not present

## 2022-09-19 DIAGNOSIS — Z5181 Encounter for therapeutic drug level monitoring: Secondary | ICD-10-CM | POA: Diagnosis not present

## 2022-09-19 DIAGNOSIS — R200 Anesthesia of skin: Secondary | ICD-10-CM | POA: Diagnosis not present

## 2022-09-19 DIAGNOSIS — I10 Essential (primary) hypertension: Secondary | ICD-10-CM | POA: Diagnosis not present

## 2022-09-19 DIAGNOSIS — E782 Mixed hyperlipidemia: Secondary | ICD-10-CM | POA: Diagnosis not present

## 2022-09-19 DIAGNOSIS — R1013 Epigastric pain: Secondary | ICD-10-CM | POA: Diagnosis not present

## 2022-10-02 DIAGNOSIS — S61412A Laceration without foreign body of left hand, initial encounter: Secondary | ICD-10-CM | POA: Diagnosis not present

## 2022-10-23 ENCOUNTER — Other Ambulatory Visit: Payer: Self-pay | Admitting: Internal Medicine

## 2022-10-23 ENCOUNTER — Ambulatory Visit
Admission: RE | Admit: 2022-10-23 | Discharge: 2022-10-23 | Disposition: A | Discharge status: Home / Self Care | Payer: Managed Care, Other (non HMO) | Source: Ambulatory Visit | Attending: Internal Medicine | Admitting: Internal Medicine

## 2022-10-23 ENCOUNTER — Ambulatory Visit: Payer: Managed Care, Other (non HMO) | Attending: Physician Assistant | Admitting: Physician Assistant

## 2022-10-23 ENCOUNTER — Encounter: Payer: Self-pay | Admitting: Physician Assistant

## 2022-10-23 VITALS — BP 110/70 | HR 58 | Ht 72.0 in | Wt 248.8 lb

## 2022-10-23 DIAGNOSIS — M79644 Pain in right finger(s): Secondary | ICD-10-CM

## 2022-10-23 DIAGNOSIS — I10 Essential (primary) hypertension: Secondary | ICD-10-CM

## 2022-10-23 DIAGNOSIS — E785 Hyperlipidemia, unspecified: Secondary | ICD-10-CM | POA: Diagnosis not present

## 2022-10-23 DIAGNOSIS — I2510 Atherosclerotic heart disease of native coronary artery without angina pectoris: Secondary | ICD-10-CM | POA: Insufficient documentation

## 2022-10-23 DIAGNOSIS — Z01810 Encounter for preprocedural cardiovascular examination: Secondary | ICD-10-CM

## 2022-10-23 DIAGNOSIS — I2584 Coronary atherosclerosis due to calcified coronary lesion: Secondary | ICD-10-CM | POA: Diagnosis not present

## 2022-10-23 MED ORDER — ROSUVASTATIN CALCIUM 10 MG PO TABS: 10.0000 mg | ORAL_TABLET | Freq: Every day | ORAL | 3 refills | Status: DC

## 2022-10-23 NOTE — Assessment & Plan Note (Signed)
Labs from PCP reviewed. 10/23/22: TC 94, TG 180, LDL 33, HDL 32. LDL is optimal. Continue Crestor 10 mg once daily.

## 2022-10-23 NOTE — Progress Notes (Signed)
Cardiology Office Note:    Date:  10/23/2022  ID:  Phillip [name], DOB 07-09-1956, MRN [id number] [facility] Providers Cardiologist:  Nicki Guadalajara, MD      Patient Profile:   Nonobstructive CAD CCTA 02/14/2021: Aorta normal size, CAC score 75 (55th percentile); nonobstructive CAD (proximal LAD 0-24); aortic atherosclerosis TTE 03/03/2021: EF 65-70, no RWMA, moderate LVH, GR 1 DD, normal RVSF, trivial MR, borderline dilation of aortic root (38 mm), mild dilation of ascending aorta (42 mm). Dilated aorta Mild by echocardiogram in 2022 Normal size on CCTA in 2022 Hypertension Hyperlipidemia GERD  FHx CAD Colon CA s/p resection     History of Present Illness:   Phillip [name] is a 67 y.o. male returns for follow-up on CAD.  He was last seen by Dr. Tresa Endo 08/15/2021. He is here alone. He is doing well w/o chest pain, shortness of breath syncope, orthopnea, leg edema.   Review of Systems  Cardiovascular:  Negative for claudication.       Studies Reviewed:    EKG:  Sinus bradycardia, HR 58, normal axis, inf Qs, QTc 380 ms, no acute STTW changes; similar to old EKGs.   Risk Assessment/Calculations:             Physical Exam:   VS:  BP 110/70   Pulse (!) 58   Ht 6' (1.829 m)   Wt 248 lb 12.8 oz (112.9 kg)   SpO2 96%   BMI 33.74 kg/m    Wt Readings from Last 3 Encounters:  10/23/22 248 lb 12.8 oz (112.9 kg)  02/16/22 253 lb (114.8 kg)  08/15/21 245 lb 12.8 oz (111.5 kg)    Constitutional:      Appearance: Healthy appearance. Not in distress.  Neck:     Vascular: No carotid bruit. JVD normal.  Pulmonary:     Breath sounds: Normal breath sounds. No wheezing. No rales.  Cardiovascular:     Normal rate. Regular rhythm. Normal S1. Normal S2.      Murmurs: There is no murmur.  Edema:    Peripheral edema absent.  Abdominal:     Palpations: Abdomen is soft.       ASSESSMENT AND PLAN:   Mild coronary artery calcification Mild nonobstructive plaque on CCTA in 2022.  He is doing well w/o angina. He also has aortic atherosclerosis on a prior CT. Continue ASA 81 mg once daily, Crestor 10 mg once daily. F/u 1 year.  Hyperlipidemia LDL goal <70 Labs from PCP reviewed. 10/23/22: TC 94, TG 180, LDL 33, HDL 32. LDL is optimal. Continue Crestor 10 mg once daily.   Essential hypertension BP well controlled. Continue Amlodipine 5 mg once daily, Valsartan 160 mg once daily. He did ask about getting off some meds. If he can maintain regular exercise and lose 10-15 lbs, he may be able to reduce or eliminate at least 1 BP med.   Preoperative cardiovascular examination Mr. Battaglia perioperative risk of a major cardiac event is 0.4% according to the Revised Cardiac Risk Index (RCRI).  Therefore, he is at low risk for perioperative complications.   His functional capacity is good at 4.31 METs according to the Duke Activity Status Index (DASI). Recommendations: According to ACC/AHA guidelines, no further cardiovascular testing needed.  The patient may proceed to surgery at acceptable risk.   Antiplatelet and/or Anticoagulation Recommendations: Aspirin can be held for 7 days prior to his surgery.  Please resume Aspirin post operatively when it is felt to be safe from  a bleeding standpoint.        Dispo:  Return in about 1 year (around 10/23/2023) for Routine Follow Up w Dr. Tresa Endo.  Signed, Tereso Newcomer, PA-C

## 2022-10-23 NOTE — Assessment & Plan Note (Signed)
Mild nonobstructive plaque on CCTA in 2022. He is doing well w/o angina. He also has aortic atherosclerosis on a prior CT. Continue ASA 81 mg once daily, Crestor 10 mg once daily. F/u 1 year.

## 2022-10-23 NOTE — Assessment & Plan Note (Addendum)
Mr. Rhein perioperative risk of a major cardiac event is 0.4% according to the Revised Cardiac Risk Index (RCRI).  Therefore, he is at low risk for perioperative complications.   His functional capacity is good at 4.31 METs according to the Duke Activity Status Index (DASI). Recommendations: According to ACC/AHA guidelines, no further cardiovascular testing needed.  The patient may proceed to surgery at acceptable risk.   Antiplatelet and/or Anticoagulation Recommendations: Aspirin can be held for 7 days prior to his surgery.  Please resume Aspirin post operatively when it is felt to be safe from a bleeding standpoint.

## 2022-10-23 NOTE — Assessment & Plan Note (Signed)
BP well controlled. Continue Amlodipine 5 mg once daily, Valsartan 160 mg once daily. He did ask about getting off some meds. If he can maintain regular exercise and lose 10-15 lbs, he may be able to reduce or eliminate at least 1 BP med.

## 2022-10-23 NOTE — Patient Instructions (Signed)
Medication Instructions:  Your physician has recommended you make the following change in your medication:   After your Colonoscopy, you can restart Aspirin 81 mg taking 1 daily  *If you need a refill on your cardiac medications before your next appointment, please call your pharmacy*   Lab Work: None ordered  If you have labs (blood work) drawn today and your tests are completely normal, you will receive your results only by: MyChart Message (if you have MyChart) OR A paper copy in the mail If you have any lab test that is abnormal or we need to change your treatment, we will call you to review the results.   Testing/Procedures: None ordered   Follow-Up: At [facility], you and your health needs are our priority.  As part of our continuing mission to provide you with exceptional heart care, we have created designated Provider Care Teams.  These Care Teams include your primary Cardiologist (physician) and Advanced Practice Providers (APPs -  Physician Assistants and Nurse Practitioners) who all work together to provide you with the care you need, when you need it.  We recommend signing up for the patient portal called "MyChart".  Sign up information is provided on this After Visit Summary.  MyChart is used to connect with patients for Virtual Visits (Telemedicine).  Patients are able to view lab/test results, encounter notes, upcoming appointments, etc.  Non-urgent messages can be sent to your provider as well.   To learn more about what you can do with MyChart, go to [email or web address].    Your next appointment:   1 year(s)  Provider:   Nicki Guadalajara, MD     Other Instructions

## 2022-10-30 ENCOUNTER — Encounter: Payer: Managed Care, Other (non HMO) | Admitting: Physical Therapy

## 2022-11-01 ENCOUNTER — Ambulatory Visit: Payer: 59 | Attending: Internal Medicine | Admitting: Physical Therapy

## 2022-11-01 DIAGNOSIS — H8112 Benign paroxysmal vertigo, left ear: Secondary | ICD-10-CM | POA: Insufficient documentation

## 2022-11-08 ENCOUNTER — Encounter: Payer: Self-pay | Admitting: Physical Therapy

## 2022-11-08 ENCOUNTER — Ambulatory Visit: Payer: 59 | Admitting: Physical Therapy

## 2022-11-08 DIAGNOSIS — H8112 Benign paroxysmal vertigo, left ear: Secondary | ICD-10-CM

## 2022-11-08 NOTE — Therapy (Signed)
OUTPATIENT PHYSICAL THERAPY VESTIBULAR EVALUATION     Patient Name: Phillip [name] MRN: [id number] DOB:01-13-1956, 67 y.o., male Today's Date: 11/08/2022  END OF SESSION:  PT End of Session - 11/08/22 1907     Visit Number 1    Number of Visits 4    Date for PT Re-Evaluation 12/07/22   pushed out due to scheduling   Authorization Type [employer]    PT Start Time [phone]    PT Stop Time 0940    PT Time Calculation (min) 50 min    Activity Tolerance Patient tolerated treatment well    Behavior During Therapy [facility] for tasks assessed/performed             Past Medical History:  Diagnosis Date   Arthritis    fingers left little finger,4th joint after.   Blood pressure elevated    controls with diet and aspirin   Cancer (HCC)    colon cancer dx.-surgery with 2 inches of rectum removed.only 5 yrs ago   Constipation    uses fiber to help regulate   Fracture, radius    left arm, age 31    Hypertension    PONV (postoperative nausea and vomiting)    Torn meniscus 08/02/2020   Larey Seat on sleet/ice   Past Surgical History:  Procedure Laterality Date   ANTERIOR CERVICAL DECOMP/DISCECTOMY FUSION Right    APPENDECTOMY     BACK SURGERY     rupture disc.   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)   COLONOSCOPY WITH PROPOFOL N/A 12/06/2014   Procedure: COLONOSCOPY WITH PROPOFOL;  Surgeon: Charolett Bumpers, MD;  Location: [facility];  Service: Endoscopy;  Laterality: N/A;   FINGER SURGERY Left    left index finger"wart removal"   KNEE CARTILAGE SURGERY Right 09/23/2020   Repair torn Meniscus    NASAL RECONSTRUCTION     trauma repair   VASECTOMY     Patient Active Problem List   Diagnosis Date Noted   Mild coronary artery calcification 10/23/2022   Hyperlipidemia LDL goal <70 10/23/2022   Essential hypertension 10/23/2022   Preoperative cardiovascular examination 10/23/2022    PCP: Emilio Aspen, MD REFERRING PROVIDER: Emilio Aspen,  MD  REFERRING DIAG:  Diagnosis  R42 (ICD-10-CM) - Dizziness and giddiness    THERAPY DIAG:  BPPV (benign paroxysmal positional vertigo), left  ONSET DATE: March 2024   Rationale for Evaluation and Treatment: Rehabilitation  SUBJECTIVE:   SUBJECTIVE STATEMENT: Pt reports about a month ago he felt a pop in his neck when he was getting up out of bed and has had intermittent dizziness ever since this occurred  Pt accompanied by:  wife  PERTINENT HISTORY: h/o neck surgery (ACDF) approx. 9 yrs ago; back surgery 20 yrs ago  PAIN:  Are you having pain?  Pt reports tenderness in Lt upper trap and neck since it popped about a month ago  PRECAUTIONS: None  WEIGHT BEARING RESTRICTIONS: No  FALLS: Has patient fallen in last 6 months? No  LIVING ENVIRONMENT: Lives with: lives with their spouse Lives in: House/apartment  PLOF: Independent  PATIENT GOALS: resolve the vertigo  OBJECTIVE:   No Significant postural limitations  Cervical ROM:  WNL's   GAIT: Gait pattern: WFL Distance walked: 53' Assistive device utilized: None Level of assistance: Complete Independence Comments: no device used   PATIENT SURVEYS:  FOTO score 58/100; risk adjusted 62/100  VESTIBULAR ASSESSMENT:  GENERAL OBSERVATION: pt is a 67 yr old gentleman with  c/o intermittent dizziness - occurs when he looks up and rolls onto Lt side   SYMPTOM BEHAVIOR:  Subjective history: pt reports dizziness started about a month ago when he was getting out of bed  Non-Vestibular symptoms:  N/A  Type of dizziness: Spinning/Vertigo  Frequency: varies - if he does something with his left side  Duration: few secs.; the longest duration has been about 10-15 secs (about a month ago)  Aggravating factors: Induced by position change: rolling to the left  Relieving factors: lying supine  Progression of symptoms: better   POSITIONAL TESTING: Right Dix-Hallpike: no nystagmus Left Dix-Hallpike: upbeating, left  nystagmus    VESTIBULAR TREATMENT:                                                                                                   DATE: 11-08-22  Canalith Repositioning:  Epley Left: Number of Reps: 4, Response to Treatment: symptoms improved, and Comment: no nystagmus and no c/o dizziness on 4th rep  PATIENT EDUCATION: Education details: article from [employer] on BPPV etiology given to patient and also picture of Epley for Lt BPPV for self treatment prn Person educated: Patient and Spouse Education method: Explanation, Demonstration, and Handouts Education comprehension: verbalized understanding  HOME EXERCISE PROGRAM:  Epley for self treatment prn; will add to HEP as appropriate   GOALS: Goals reviewed with patient? Yes  SHORT TERM GOALS: same as LTG's    LONG TERM GOALS: Target date: 11-30-22  Pt will have (-) Lt Dix-Hallpike test to indicate resolution of Lt BPPV.  Baseline:  Goal status: INITIAL  2.  Independent in Epley maneuver for self treatment of BPPV prn. Baseline:  Goal status: INITIAL  3.  Improve FOTO score to >/= 62/100 to demo improvement in dizziness. Baseline: 58/100 DPS Goal status: INITIAL  ASSESSMENT:  CLINICAL IMPRESSION: Patient is a 67 y.o. gentleman who was seen today for physical therapy evaluation and treatment for Lt BPPV posterior canalithiasis.  Pt had (+) Lt Dix-Hallpike test with Lt rotary upbeating nystagmus and c/o dizziness in test position; pt was treated with 4 reps Epley maneuver and BPPV appeared to be resolved on 4th rep with no nystagmus noted and pt reporting no dizziness in any position.  Will cont. To assess and treat prn.   OBJECTIVE IMPAIRMENTS: dizziness.   ACTIVITY LIMITATIONS: bed mobility and reach over head  PARTICIPATION LIMITATIONS:  N/A  PERSONAL FACTORS:  N/A  are also affecting patient's functional outcome.   REHAB POTENTIAL: Good  CLINICAL DECISION MAKING: Stable/uncomplicated  EVALUATION COMPLEXITY:  Low   PLAN:  PT FREQUENCY: 1x/week  PT DURATION: 4 weeks  PLANNED INTERVENTIONS: Therapeutic exercises, Therapeutic activity, Neuromuscular re-education, Balance training, Gait training, Patient/Family education, Self Care, Vestibular training, and Canalith repositioning  PLAN FOR NEXT SESSION: recheck Lt BPPV   Teya Otterson, Donavan Burnet, PT 11/08/2022, 7:08 PM

## 2022-11-08 NOTE — Patient Instructions (Signed)
Self Treatment for Left Posterior / Anterior Canalithiasis    Sitting on bed: 1. Turn head 45 left. (a) Lie back slowly, shoulders on pillow, head on bed. (b) Hold __20__ seconds. 2. Keeping head on bed, turn head 90 right. Hold __20_ seconds. 3. Roll to right, head on 45 angle down toward bed. Hold __20__ seconds. 4. Sit up on right side of bed. Repeat ___3_ times per session. Do _2___ sessions per day.  Copyright  [employer]. All rights reserved.   

## 2022-11-19 ENCOUNTER — Ambulatory Visit: Payer: 59 | Attending: Internal Medicine | Admitting: Physical Therapy

## 2023-10-14 ENCOUNTER — Other Ambulatory Visit: Payer: Self-pay | Admitting: Physician Assistant

## 2023-12-03 ENCOUNTER — Ambulatory Visit (HOSPITAL_COMMUNITY)
Admission: RE | Admit: 2023-12-03 | Discharge: 2023-12-03 | Disposition: A | Discharge status: Home / Self Care | Source: Ambulatory Visit | Attending: Internal Medicine | Admitting: Internal Medicine

## 2023-12-03 ENCOUNTER — Encounter: Payer: Self-pay | Admitting: Internal Medicine

## 2023-12-03 ENCOUNTER — Other Ambulatory Visit (HOSPITAL_COMMUNITY): Payer: Self-pay | Admitting: Internal Medicine

## 2023-12-03 DIAGNOSIS — R109 Unspecified abdominal pain: Secondary | ICD-10-CM | POA: Insufficient documentation

## 2023-12-03 DIAGNOSIS — R1032 Left lower quadrant pain: Secondary | ICD-10-CM | POA: Insufficient documentation

## 2023-12-12 ENCOUNTER — Telehealth: Payer: Self-pay | Admitting: *Deleted

## 2023-12-12 NOTE — Telephone Encounter (Signed)
 Pt has been scheduled in office appt 12/13/23 8 am @ [facility] location with Neomi Banks, NP.

## 2023-12-12 NOTE — Telephone Encounter (Signed)
   Name: Phillip [name]  DOB: Sep 04, 1955  MRN: [id number]  Primary Cardiologist: Magnus Schuller, MD  Chart reviewed as part of pre-operative protocol coverage. Because of [name] past medical history and time since last visit, he will require a follow-up in-office visit in order to better assess preoperative cardiovascular risk.  Patient is overdue for annual follow-up.  Pre-op covering staff: - Please schedule appointment and call patient to inform them. If patient already had an upcoming appointment within acceptable timeframe, please add "pre-op clearance" to the appointment notes so provider is aware. - Please contact requesting surgeon's office via preferred method (i.e, phone, fax) to inform them of need for appointment prior to surgery.   Jude Norton, NP  12/12/2023, 11:09 AM

## 2023-12-12 NOTE — Telephone Encounter (Signed)
   Pre-operative Risk Assessment    Patient Name: Phillip [name]  DOB: [birth year] MRN: [id number]   Date of last office visit: 10/23/22 Marlyse Single, [facility] Date of next office visit: NONE   Request for Surgical Clearance    Procedure:  HERNIA SURGERY  Date of Surgery:  Clearance TBD                                Surgeon:  DR. PAUL STECHCHULTE Surgeon's Group or Practice Name:  [facility] Phone number:  [phone] Fax number:  [phone] HOLLIE TROY, CMA   Type of Clearance Requested:   - Medical L NONE INDICATED TO BE HELD   Type of Anesthesia:  General    Additional requests/questions:    Princeton Broom   12/12/2023, 10:09 AM

## 2023-12-13 ENCOUNTER — Ambulatory Visit (HOSPITAL_BASED_OUTPATIENT_CLINIC_OR_DEPARTMENT_OTHER): Admitting: Family

## 2023-12-13 ENCOUNTER — Encounter (HOSPITAL_BASED_OUTPATIENT_CLINIC_OR_DEPARTMENT_OTHER): Payer: Self-pay | Admitting: Family

## 2023-12-13 VITALS — BP 116/70 | HR 64 | Ht 73.0 in | Wt 228.3 lb

## 2023-12-13 DIAGNOSIS — E785 Hyperlipidemia, unspecified: Secondary | ICD-10-CM

## 2023-12-13 DIAGNOSIS — I2510 Atherosclerotic heart disease of native coronary artery without angina pectoris: Secondary | ICD-10-CM | POA: Diagnosis not present

## 2023-12-13 DIAGNOSIS — I10 Essential (primary) hypertension: Secondary | ICD-10-CM

## 2023-12-13 DIAGNOSIS — Z01818 Encounter for other preprocedural examination: Secondary | ICD-10-CM | POA: Diagnosis not present

## 2023-12-13 NOTE — Progress Notes (Signed)
 Cardiology Office Note   Date:  12/13/2023  ID:  [name], Phillip 1956/05/01, MRN [id number] PCP: Benedetta Bradley, MD  [facility] Providers Cardiologist:  Magnus Schuller, MD     History of Present Illness Phillip [name] is a 68 y.o. [name] with hx of nonobstructive CAD, dilated aorta (mild by echocardiogram 2022, normal-sized on CCTA 2022), hypertension, hyperlipidemia, GERD, colon cancer s/p resection.  Family history notable for CAD.  CCTA 02/14/2021 normal aorta size, calcium  score 75 placing in the 55th percentile with nonobstructive CAD (proximal LAD 0 to 24%), aortic atherosclerosis.  TTE 03/03/2021 LVEF 65 to 70%, no RWMA, moderate LVH, grade 1 diastolic dysfunction, normal RV SF, trivial MR, borderline dilation aortic root 30 mm mild elation ascending aorta 42 mm.  Presents today for preop clearance for hernia surgery with his wife. Doing well since last seen. Works a very active job pulling chain, lifting though presently out of work due to hernia. Reports no shortness of breath nor dyspnea on exertion. Reports no chest pain, pressure, or tightness. No edema, orthopnea, PND. Reports occasional palpitations which are not bothersome, reviewed PVC on EKG today.   ROS: Please see the history of present illness.    All other systems reviewed and are negative.   Studies Reviewed EKG Interpretation Date/Time:  Friday Dec 13 2023 08:23:51 EDT Ventricular Rate:  64 PR Interval:  164 QRS Duration:  84 QT Interval:  382 QTC Calculation: 394 R Axis:   31  Text Interpretation: Sinus rhythm with occasional Premature ventricular complexes No acute ST/T wave changes Confirmed by Neomi Banks ([id number]) on 12/13/2023 8:39:04 AM    Cardiac Studies & Procedures   ______________________________________________________________________________________________     ECHOCARDIOGRAM  ECHOCARDIOGRAM COMPLETE 03/03/2021  Narrative ECHOCARDIOGRAM REPORT    Patient Name:   Phillip [name] Date of Exam: 03/03/2021 Medical Rec #:  [id number]       Height:       72.0 in Accession #:    [id number]      Weight:       257.2 lb Date of Birth:  25-Jan-1956       BSA:          2.371 m Patient Age:    65 years        BP:           148/82 mmHg Patient Gender: M               HR:           81 bpm. Exam Location:  [address]  Procedure: 2D Echo, Cardiac Doppler and Color Doppler  Indications:    R07.9* Chest pain, unspecified  History:        Patient has no prior history of Echocardiogram examinations. Risk Factors:Hypertension and Family History of Coronary Artery Disease. Obesity. Epigastric pain.  Sonographer:    Mylinda Asa RCS Referring Phys: [phone] THOMAS A KELLY  IMPRESSIONS   1. Left ventricular ejection fraction, by estimation, is 65 to 70%. The left ventricle has normal function. The left ventricle has no regional wall motion abnormalities. There is moderate left ventricular hypertrophy. Left ventricular diastolic parameters are consistent with Grade I diastolic dysfunction (impaired relaxation). 2. Right ventricular systolic function is normal. The right ventricular size is normal. 3. The mitral valve is abnormal. Trivial mitral valve regurgitation. 4. The aortic valve is tricuspid. Aortic valve regurgitation is not visualized. 5. Aortic dilatation noted. There is borderline dilatation of the aortic root,  measuring 38 mm. There is mild dilatation of the ascending aorta, measuring 42 mm.  Comparison(s): No prior Echocardiogram.  FINDINGS Left Ventricle: Left ventricular ejection fraction, by estimation, is 65 to 70%. The left ventricle has normal function. The left ventricle has no regional wall motion abnormalities. The left ventricular internal cavity size was normal in size. There is moderate left ventricular hypertrophy. Left ventricular diastolic parameters are consistent with Grade I diastolic dysfunction (impaired relaxation). Indeterminate filling  pressures.  Right Ventricle: The right ventricular size is normal. No increase in right ventricular wall thickness. Right ventricular systolic function is normal.  Left Atrium: Left atrial size was normal in size.  Right Atrium: Right atrial size was normal in size.  Pericardium: There is no evidence of pericardial effusion.  Mitral Valve: The mitral valve is abnormal. There is mild thickening of the mitral valve leaflet(s). Trivial mitral valve regurgitation.  Tricuspid Valve: The tricuspid valve is grossly normal. Tricuspid valve regurgitation is trivial.  Aortic Valve: The aortic valve is tricuspid. Aortic valve regurgitation is not visualized.  Pulmonic Valve: The pulmonic valve was grossly normal. Pulmonic valve regurgitation is trivial.  Aorta: Aortic dilatation noted. There is borderline dilatation of the aortic root, measuring 38 mm. There is mild dilatation of the ascending aorta, measuring 42 mm.  IAS/Shunts: No atrial level shunt detected by color flow Doppler.   LEFT VENTRICLE PLAX 2D LVIDd:         4.60 cm  Diastology LVIDs:         2.60 cm  LV e' medial:    6.42 cm/s LV PW:         1.30 cm  LV E/e' medial:  10.5 LV IVS:        1.50 cm  LV e' lateral:   7.40 cm/s LVOT diam:     2.40 cm  LV E/e' lateral: 9.1 LV SV:         75 LV SV Index:   31 LVOT Area:     4.52 cm   RIGHT VENTRICLE RV S prime:     18.60 cm/s TAPSE (M-mode): 2.5 cm  LEFT ATRIUM             Index LA diam:        4.70 cm 1.98 cm/m LA Vol (A2C):   61.2 ml 25.81 ml/m LA Vol (A4C):   58.2 ml 24.55 ml/m LA Biplane Vol: 60.3 ml 25.43 ml/m AORTIC VALVE LVOT Vmax:   91.40 cm/s LVOT Vmean:  56.600 cm/s LVOT VTI:    0.165 m  AORTA Ao Root diam: 3.80 cm  MITRAL VALVE MV Area (PHT): 4.17 cm    SHUNTS MV Decel Time: 182 msec    Systemic VTI:  0.16 m MV E velocity: 67.70 cm/s  Systemic Diam: 2.40 cm MV A velocity: 75.40 cm/s MV E/A ratio:  0.90  Dinah Franco MD Electronically signed by  Dinah Franco MD Signature Date/Time: 03/03/2021/11:17:13 AM    Final      CT SCANS  CT CORONARY MORPH W/CTA COR W/SCORE 02/14/2021  Addendum 02/14/2021  4:05 PM ADDENDUM REPORT: 02/14/2021 16:02  CLINICAL DATA:  68 year old with chest pain  EXAM: Cardiac/Coronary  CTA  TECHNIQUE: The patient was scanned on a [employer].  FINDINGS: A 100 kV prospective scan was triggered in the descending thoracic aorta at 111 HU's. Axial non-contrast 3 mm slices were carried out through the heart. The data set was analyzed on a dedicated work station and scored  using the Agatson method. Gantry rotation speed was 250 msecs and collimation was .6 mm. No beta blockade and 0.8 mg of sl NTG was given. The 3D data set was reconstructed in 5% intervals of the 67-82 % of the R-R cycle. Diastolic phases were analyzed on a dedicated work station using MPR, MIP and VRT modes. The patient received 80 cc of contrast.  There is misregistration artifact.  Images are interpretable.  Aorta:  Normal size.  No calcifications.  No dissection.  Aortic Valve:  Trileaflet.  No calcifications.  Coronary Arteries:  Normal coronary origin.  Right dominance.  RCA is a large dominant tortuous artery that gives rise to PDA and PLA. There is no plaque.  Left main is a large tortuous artery that gives rise to LAD and LCX arteries.  LAD is a large vessel that has scattered calcified proximal plaque, 0-24% stenosis.  LCX is a non-dominant artery that gives rise to one large OM1 branch. There is no plaque.  Cardiac calcium  score:  LM: 0  LAD: 75  LCX: 0  RCA: 0  Total: 75  Percentile: 55%  Other findings:  Normal pulmonary vein drainage into the left atrium.  Normal left atrial appendage without a thrombus.  Normal size of the pulmonary artery.  Please see radiology report for non cardiac findings.  IMPRESSION: 1. Coronary calcium  score of 75. This was 36 percentile for age  and sex matched control.  2. Normal coronary origin with right dominance.  3. Mild scattered calcified proximal LAD plaque, 0-24% stenosis. Non flow limiting.  4. CAD-RADS 1. Minimal non-obstructive CAD (0-24%). Consider non-atherosclerotic causes of chest pain. Consider preventive therapy and risk factor modification.   Electronically Signed By: Dorothye Gathers MD On: 02/14/2021 16:02  Narrative EXAM: OVER-READ INTERPRETATION  CT CHEST  The following report is an over-read performed by radiologist Dr. Lore Rode of [facility] on 02/14/2021. This over-read does not include interpretation of cardiac or coronary anatomy or pathology. The coronary CTA interpretation by the cardiologist is attached.  COMPARISON:  02/02/2021 chest radiograph.  Chest CT 01/13/2009  FINDINGS: Vascular: Ascending aorta upper normal, including at 3.8 cm. Aortic atherosclerosis. Tortuous thoracic aorta.  Mediastinum/Nodes: No imaged thoracic adenopathy.  Lungs/Pleura: No pleural fluid.  Clear imaged lungs.  Upper Abdomen: Segment 2 2.7 cm hepatic cyst was present in 2010. Normal imaged portions of the spleen, stomach.  Musculoskeletal: No acute osseous abnormality.  IMPRESSION: No acute findings in the imaged extracardiac chest.  Aortic Atherosclerosis (ICD10-I70.0).  Electronically Signed: By: Lore Rode M.D. On: 02/14/2021 14:59     ______________________________________________________________________________________________      Risk Assessment/Calculations           Physical Exam VS:  BP 116/70   Pulse 64   Ht 6\' 1"  (1.854 m)   Wt 228 lb 4.8 oz (103.6 kg)   SpO2 98%   BMI 30.12 kg/m    Wt Readings from Last 3 Encounters:  12/13/23 228 lb 4.8 oz (103.6 kg)  10/23/22 248 lb 12.8 oz (112.9 kg)  02/16/22 253 lb (114.8 kg)    GEN: Well nourished, well developed in no acute distress NECK: No JVD; No carotid bruits CARDIAC: RRR, no murmurs, rubs,  gallops RESPIRATORY:  Clear to auscultation without rales, wheezing or rhonchi  ABDOMEN: Soft, non-tender, non-distended EXTREMITIES:  No edema; No deformity   ASSESSMENT AND PLAN  Preop clearance - Pending hernia surgery. According to the Revised Cardiac Risk Index (RCRI), his Perioperative Risk of  Major Cardiac Event is (%): 0.9. His Functional Capacity in METs is: 7.99 according to the Duke Activity Status Index (DASI). Per AHA/ACC guidelines, he is deemed acceptable risk for the planned procedure without additional cardiovascular testing. Will route to surgical team so they are aware.    Nonobstructive CAD- Stable with no anginal symptoms. No indication for ischemic evaluation.  GDMT includes Rosuvastatin  10mg  daily. Recommend aiming for 150 minutes of moderate intensity activity per week and following a heart healthy diet.    HLD, LDL goal less than 70- Continue Rosuvastatin  10mg  daily. Lipid panel, LFT today.  HTN- BP well controlled. Continue current antihypertensive regimen Amlodipine 5mg  daily, Valsartan 160mg  daily. Relatively hypotensive in clinic today  but asymptomatic with no lightheadedness, dizziness.          Dispo: follow up in 1 year with Marlyse Single, PA  Signed, Clearnce Curia, NP

## 2023-12-13 NOTE — Patient Instructions (Signed)
 Medication Instructions:  Continue your current medications  *If you need a refill on your cardiac medications before your next appointment, please call your pharmacy*  Lab Work: Your physician recommends that you return for lab work today: lipid panel, LFT's If you have labs (blood work) drawn today and your tests are completely normal, you will receive your results only by: MyChart Message (if you have MyChart) OR A paper copy in the mail If you have any lab test that is abnormal or we need to change your treatment, we will call you to review the results.  Testing/Procedures: Your EKG today looked great and showed normal sinus rhythm. It showed an occasional early heart beat called a PVC which is normal and not of concern.   Follow-Up: At [facility], you and your health needs are our priority.  As part of our continuing mission to provide you with exceptional heart care, our providers are all part of one team.  This team includes your primary Cardiologist (physician) and Advanced Practice Providers or APPs (Physician Assistants and Nurse Practitioners) who all work together to provide you with the care you need, when you need it.  Your next appointment:   1 year(s)  Provider:   Marlyse Single, PA-C          We recommend signing up for the patient portal called "MyChart".  Sign up information is provided on this After Visit Summary.  MyChart is used to connect with patients for Virtual Visits (Telemedicine).  Patients are able to view lab/test results, encounter notes, upcoming appointments, etc.  Non-urgent messages can be sent to your provider as well.   To learn more about what you can do with MyChart, go to [email or web address].   Other Instructions  Heart Healthy Diet Recommendations: A low-salt diet is recommended. Meats should be grilled, baked, or boiled. Avoid fried foods. Focus on lean protein sources like fish or chicken with vegetables and fruits. The American  Heart Association is a Chief Technology Officer!  American Heart Association Diet and Lifeystyle Recommendations   Exercise recommendations: The American Heart Association recommends 150 minutes of moderate intensity exercise weekly. Try 30 minutes of moderate intensity exercise 4-5 times per week. This could include walking, jogging, or swimming.

## 2023-12-14 LAB — HEPATIC FUNCTION PANEL
ALT: 22 IU/L (ref 0–44)
AST: 18 IU/L (ref 0–40)
Albumin: 4.7 g/dL (ref 3.9–4.9)
Alkaline Phosphatase: 76 IU/L (ref 44–121)
Bilirubin Total: 0.6 mg/dL (ref 0.0–1.2)
Bilirubin, Direct: 0.22 mg/dL (ref 0.00–0.40)
Total Protein: 6.8 g/dL (ref 6.0–8.5)

## 2023-12-14 LAB — LIPID PANEL
Chol/HDL Ratio: 3 ratio (ref 0.0–5.0)
Cholesterol, Total: 111 mg/dL (ref 100–199)
HDL: 37 mg/dL — ABNORMAL LOW (ref >39)
LDL Chol Calc (NIH): 47 mg/dL (ref 0–99)
Triglycerides: 159 mg/dL — ABNORMAL HIGH (ref 0–149)
VLDL Cholesterol Cal: 27 mg/dL (ref 5–40)

## 2023-12-15 ENCOUNTER — Ambulatory Visit (HOSPITAL_BASED_OUTPATIENT_CLINIC_OR_DEPARTMENT_OTHER): Payer: Self-pay | Admitting: Family

## 2024-01-02 ENCOUNTER — Other Ambulatory Visit: Payer: Self-pay | Admitting: Physician Assistant

## 2024-04-28 ENCOUNTER — Other Ambulatory Visit: Payer: Self-pay | Admitting: Physician Assistant

## 2024-06-14 ENCOUNTER — Encounter (HOSPITAL_COMMUNITY): Payer: Self-pay | Admitting: Emergency Medicine

## 2024-06-14 ENCOUNTER — Emergency Department (HOSPITAL_COMMUNITY)
Admission: EM | Admit: 2024-06-14 | Discharge: 2024-06-14 | Disposition: A | Discharge status: Home / Self Care | Payer: Self-pay | Attending: Emergency Medicine | Admitting: Emergency Medicine

## 2024-06-14 ENCOUNTER — Emergency Department (HOSPITAL_COMMUNITY): Payer: Self-pay

## 2024-06-14 ENCOUNTER — Other Ambulatory Visit: Payer: Self-pay

## 2024-06-14 DIAGNOSIS — Y92410 Unspecified street and highway as the place of occurrence of the external cause: Secondary | ICD-10-CM | POA: Diagnosis not present

## 2024-06-14 DIAGNOSIS — R0789 Other chest pain: Secondary | ICD-10-CM | POA: Diagnosis present

## 2024-06-14 DIAGNOSIS — I10 Essential (primary) hypertension: Secondary | ICD-10-CM | POA: Diagnosis not present

## 2024-06-14 DIAGNOSIS — I2510 Atherosclerotic heart disease of native coronary artery without angina pectoris: Secondary | ICD-10-CM | POA: Diagnosis not present

## 2024-06-14 DIAGNOSIS — Z79899 Other long term (current) drug therapy: Secondary | ICD-10-CM | POA: Diagnosis not present

## 2024-06-14 LAB — COMPREHENSIVE METABOLIC PANEL WITH GFR
ALT: 22 U/L (ref 0–44)
AST: 23 U/L (ref 15–41)
Albumin: 4 g/dL (ref 3.5–5.0)
Alkaline Phosphatase: 62 U/L (ref 38–126)
Anion gap: 12 (ref 5–15)
BUN: 11 mg/dL (ref 8–23)
CO2: 23 mmol/L (ref 22–32)
Calcium: 9 mg/dL (ref 8.9–10.3)
Chloride: 103 mmol/L (ref 98–111)
Creatinine, Ser: 0.88 mg/dL (ref 0.61–1.24)
GFR, Estimated: >60 mL/min (ref >60)
Glucose, Bld: 214 mg/dL — ABNORMAL HIGH (ref 70–99)
Potassium: 3.7 mmol/L (ref 3.5–5.1)
Sodium: 138 mmol/L (ref 135–145)
Total Bilirubin: 0.8 mg/dL (ref 0.0–1.2)
Total Protein: 6.5 g/dL (ref 6.5–8.1)

## 2024-06-14 LAB — CBC
HCT: 41.4 % (ref 39.0–52.0)
Hemoglobin: 14.5 g/dL (ref 13.0–17.0)
MCH: 33.4 pg (ref 26.0–34.0)
MCHC: 35 g/dL (ref 30.0–36.0)
MCV: 95.4 fL (ref 80.0–100.0)
Platelets: 203 K/uL (ref 150–400)
RBC: 4.34 MIL/uL (ref 4.22–5.81)
RDW: 12.2 % (ref 11.5–15.5)
WBC: 6 K/uL (ref 4.0–10.5)
nRBC: 0 % (ref 0.0–0.2)

## 2024-06-14 LAB — TROPONIN I (HIGH SENSITIVITY): Troponin I (High Sensitivity): 11 ng/L (ref <18)

## 2024-06-14 MED ORDER — KETOROLAC TROMETHAMINE 30 MG/ML IJ SOLN
15.0000 mg | Freq: Once | INTRAMUSCULAR | Status: AC
  Administered 2024-06-14: 15 mg via INTRAMUSCULAR
  Filled 2024-06-14: qty 1

## 2024-06-14 MED ORDER — KETOROLAC TROMETHAMINE 30 MG/ML IJ SOLN: 15.0000 mg | Freq: Once | INTRAMUSCULAR | Status: DC

## 2024-06-14 MED ORDER — HYDROCODONE-ACETAMINOPHEN 5-325 MG PO TABS
1.0000 | ORAL_TABLET | Freq: Once | ORAL | Status: AC
  Administered 2024-06-14: 1 via ORAL
  Filled 2024-06-14: qty 1

## 2024-06-14 NOTE — Discharge Instructions (Addendum)
 Evaluation here was reassuring.  Suspect you do have some soft tissue inflammation in your right and left upper chest.  Recommend applying ice 3-4 times a day and taking ibuprofen along with Tylenol if needed.  Please follow-up your PCP.  If you develop worsening chest pain, shortness of breath or any other concerning symptom please return to the ED for further evaluation.

## 2024-06-14 NOTE — ED Notes (Signed)
 Patient able to ambulate to and from room to restroom independently. Patient exhibits steady gait without lightheadedness or dizziness.
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
